# Patient Record
Sex: Female | Born: 1955
Health system: Southern US, Community
[De-identification: ages and names within clinical notes are randomized; demographics above are authoritative.]

## PROBLEM LIST (undated history)

## (undated) DIAGNOSIS — M797 Fibromyalgia: Secondary | ICD-10-CM

## (undated) DIAGNOSIS — M5030 Other cervical disc degeneration, unspecified cervical region: Secondary | ICD-10-CM

## (undated) HISTORY — PX: OTHER SURGICAL HISTORY: SHX169

## (undated) HISTORY — PX: TUBAL LIGATION: SHX77

## (undated) HISTORY — PX: TOTAL SHOULDER ARTHROPLASTY: SHX126

## (undated) HISTORY — DX: Other cervical disc degeneration, unspecified cervical region: M50.30

## (undated) HISTORY — PX: KNEE ARTHROPLASTY: SHX992

## (undated) HISTORY — DX: Fibromyalgia: M79.7

## (undated) HISTORY — PX: ABDOMINAL HYSTERECTOMY: SHX81

---

## 2002-09-17 ENCOUNTER — Ambulatory Visit (HOSPITAL_COMMUNITY): Admission: RE | Admit: 2002-09-17 | Discharge: 2002-09-17 | Payer: Self-pay | Admitting: General Surgery

## 2004-10-10 ENCOUNTER — Other Ambulatory Visit: Admission: RE | Admit: 2004-10-10 | Discharge: 2004-10-10 | Payer: Self-pay | Admitting: Obstetrics and Gynecology

## 2004-11-27 ENCOUNTER — Observation Stay (HOSPITAL_COMMUNITY): Admission: RE | Admit: 2004-11-27 | Discharge: 2004-11-28 | Payer: Self-pay | Admitting: Obstetrics and Gynecology

## 2004-11-27 ENCOUNTER — Encounter (INDEPENDENT_AMBULATORY_CARE_PROVIDER_SITE_OTHER): Payer: Self-pay | Admitting: *Deleted

## 2009-06-13 ENCOUNTER — Inpatient Hospital Stay (HOSPITAL_COMMUNITY): Admission: RE | Admit: 2009-06-13 | Discharge: 2009-06-15 | Payer: Self-pay | Admitting: Neurosurgery

## 2010-03-11 IMAGING — CR DG CERVICAL SPINE 2 OR 3 VIEWS
1 series · 1 of 1 positions shown · non-contrast
Comparison: None

CLINICAL DATA: C4-C6 fusion

CERVICAL SPINE - 2-3 VIEW

[view not recorded]
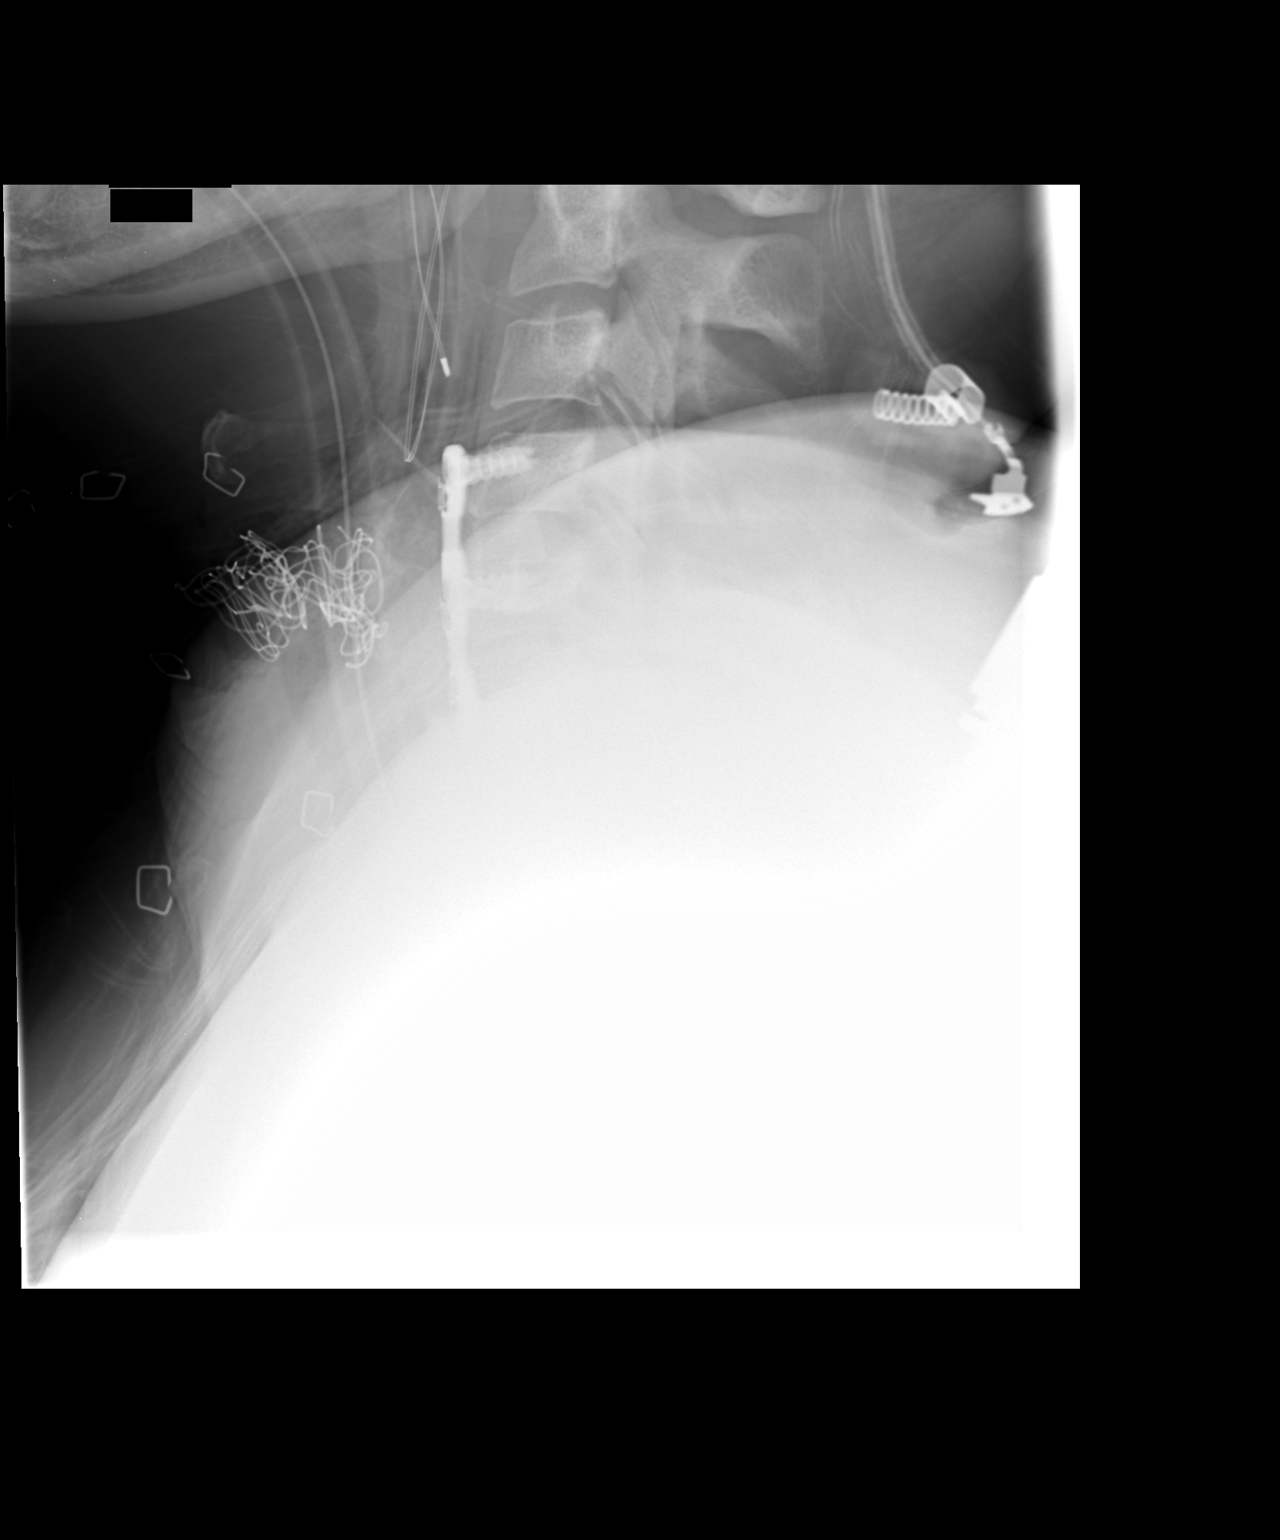

[1 of 1 positions shown; findings below may reference images not displayed]

FINDINGS: Initial film shows a needle marking the anterior disc
space of C4-5.  Second film shows anterior cervical discectomy and
fusion from C4-C6.  Interbody fusion material is in place.  There
is an anterior plate with screw fixation.  Sponges are in place
along operative approach.
IMPRESSION: ACDF C4-C7

## 2010-07-02 LAB — URINALYSIS, ROUTINE W REFLEX MICROSCOPIC
Glucose, UA: NEGATIVE mg/dL
Hgb urine dipstick: NEGATIVE
Protein, ur: NEGATIVE mg/dL
pH: 7.5 (ref 5.0–8.0)

## 2010-07-02 LAB — COMPREHENSIVE METABOLIC PANEL
Alkaline Phosphatase: 86 U/L (ref 39–117)
Creatinine, Ser: 0.77 mg/dL (ref 0.4–1.2)
GFR calc non Af Amer: >60 mL/min (ref >60)
Sodium: 139 mEq/L (ref 135–145)

## 2010-07-02 LAB — DIFFERENTIAL
Basophils Absolute: 0 10*3/uL (ref 0.0–0.1)
Eosinophils Absolute: 0 10*3/uL (ref 0.0–0.7)
Eosinophils Relative: 1 % (ref 0–5)
Lymphocytes Relative: 27 % (ref 12–46)
Lymphs Abs: 1.7 10*3/uL (ref 0.7–4.0)
Neutro Abs: 4.2 10*3/uL (ref 1.7–7.7)
Neutrophils Relative %: 66 % (ref 43–77)

## 2010-07-02 LAB — CBC
HCT: 36.2 % (ref 36.0–46.0)
MCHC: 33.7 g/dL (ref 30.0–36.0)
Platelets: 246 10*3/uL (ref 150–400)
RDW: 14.3 % (ref 11.5–15.5)

## 2010-07-02 LAB — PROTIME-INR
INR: 1.01 (ref 0.00–1.49)
Prothrombin Time: 13.2 seconds (ref 11.6–15.2)

## 2010-07-02 LAB — APTT: aPTT: 26 seconds (ref 24–37)

## 2010-07-02 LAB — SURGICAL PCR SCREEN: MRSA, PCR: NEGATIVE

## 2010-08-24 NOTE — Discharge Summary (Signed)
NAMEJAYDA, [name] NO.:  [id number]   MEDICAL RECORD NO.:  [id number]          PATIENT TYPE:  OBV   LOCATION:  9317                          [facility]ACILITY:  [facility]   PHYSICIAN:  Janine Limbo, M.D.DATE OF BIRTH:  Aug 24, 1955   DATE OF ADMISSION:  11/27/2004  DATE OF DISCHARGE:  11/28/2004                                 DISCHARGE SUMMARY   DISCHARGE DIAGNOSES:  1.  An 18-week size fibroid uterus.  2.  Anemia (hemoglobin 8.0).  3.  Dysmenorrhea.  4.  Menorrhagia.   PROCEDURES:  November 27, 2004, laparoscopy-assisted vaginal hysterectomy with  uterine morcellation.   HISTORY OF PRESENT ILLNESS:  [name] is a 55 year old female, para 3-0-2-  3, who presents with the above-mentioned diagnosis. She has not responded to  conservative measures. She presents for definitive therapy. Please see her  dictated history and physical exam for details.   ADMISSION PHYSICAL EXAM:  The patient weighs 218 pounds. Her pelvic exam is  significant for a 16-week size fibroid uterus.   HOSPITAL COURSE:  On the day of admission, the patient was taken to the  operating room where laparoscopy was performed. The patient was found to  have an 18-week size multifibroid uterus. A combination of the laparoscopic  approach plus vaginal approach was used to remove the uterus. Uterine  morcellation was required. The patient tolerated her procedure well. The  estimated blood loss for the procedure was 200 cc. Her postoperative  hemoglobin was 8.0 (preoperative hemoglobin 10.2). The patient quickly  tolerated a regular diet. The patient was not orthostatic. The patient felt  that she was ready to be discharged on postoperative day #1 and we agreed.  The patient was discharged to home in stable condition.   DISCHARGE MEDICATIONS:  1.  Vicodin 1-2 tablets every 4 hours as needed for pain.  2.  Ibuprofen 600 milligrams every 6 hours as needed for mild pain.  3.  Doxycycline 100 milligrams  twice each day for 7 days.  4.  Iron 325 milligrams 1 tablet twice each day for 6 weeks.   Final pathology report benign fibroids.      Janine Limbo, M.D.  Electronically Signed     AVS/MEDQ  D:  12/06/2004  T:  12/07/2004  Job:  [id number]

## 2010-08-24 NOTE — H&P (Signed)
[name], COMMISSO NO.:  [id number]   MEDICAL RECORD NO.:  [id number]          PATIENT TYPE:  AMB   LOCATION:  SDC                           [facility]   PHYSICIAN:  Janine Limbo, M.D.DATE OF BIRTH:  08/15/1955   DATE OF ADMISSION:  11/27/2004  DATE OF DISCHARGE:                                HISTORY & PHYSICAL   HISTORY OF PRESENT ILLNESS:  Ms. [name] is a 55 year old female, para 3-0-2-  3, who presents for a laparoscopy assisted vaginal hysterectomy.  She has  been followed at the [facility]  [facility].  The patient complains of heavy periods with  cramping.  She has tried hormonal therapy and did not tolerate the side  effects.  Nonsteroidal anti-inflammatory agents have not relieved her  discomfort.  She has had an endometrial biopsy that showed benign elements.  An ultrasound was performed that showed a 17.1-cm x 10.5-cm uterus with  multiple fibroids.  The ovaries appeared normal.  The patient's most recent  Pap smear was in July 2006, and it was within normal limits.  The patient  has had a prior tubal ligation.   OBSTETRICAL HISTORY:  1.  The patient has had three term vaginal deliveries.  2.  The patient has had two miscarriages in the first trimester.   DRUG ALLERGIES:  No known drug allergies.   PAST MEDICAL HISTORY:  1.  The patient has a history of hypercholesterolemia and she currently      takes Lipitor.  2.  She has had a tubal ligation as mentioned above.  3.  The patient has also had her wisdom teeth removed.  4.  She has had surgery on her knee.  5.  She has a history of anemia.   SOCIAL HISTORY:  The patient denies cigarette use, alcohol use, and  recreational drug use.   REVIEW OF SYSTEMS:  Please see history of present illness.   FAMILY HISTORY:  The patient has a family history of hypertension,  emphysema, anemia, and cancer.   PHYSICAL EXAMINATION:   VITAL SIGNS:  Weight is 218 pounds, height is 5 feet  7 inches.  HEENT:  Within normal limits.  CHEST:  Clear.  HEART:  Regular rate and rhythm.  BREASTS:  Without masses.  ABDOMEN:  Nontender.  There is a firm pelvic mass present.  EXTREMITIES:  Within normal limits.  NEUROLOGIC:  Grossly normal.  PELVIC:  External genitalia is normal.  The vagina is normal.  The cervix is  nontender and no lesions are appreciated.  The uterus is 16 weeks size and  irregular.  Adnexa:  No masses.  Rectovaginal exam confirms.   ASSESSMENT:  1.  A 16-week size fibroid uterus.  2.  Dysmenorrhea.  3.  Menorrhagia.   PLAN:  The patient will undergo a laparoscopy assisted vaginal hysterectomy.  She understands the indications for her surgical procedure and she accepts  the risk of, but not limited to, anesthetic complications, bleeding,  infections, and possible damage to the surrounding organs.      Janine Limbo, M.D.  Electronically Signed     AVS/MEDQ  D:  11/24/2004  T:  11/24/2004  Job:  [id number]   cc:   Rogue Jury  [address]  [place], [place] [address]

## 2010-08-24 NOTE — Op Note (Signed)
NAMECLEMMA, [name] NO.:  [id number]   MEDICAL RECORD NO.:  [id number]          PATIENT TYPE:  OBV   LOCATION:  9317                          [facility]   PHYSICIAN:  Janine Limbo, M.D.DATE OF BIRTH:  October 18, 1955   DATE OF PROCEDURE:  11/27/2004  DATE OF DISCHARGE:  11/28/2004                                 OPERATIVE REPORT   PREOPERATIVE DIAGNOSIS:  1.  16-week size fibroid uterus.  2.  Anemia.  3.  Dysmenorrhea.  4.  Menorrhagia.   POSTOPERATIVE DIAGNOSIS:  1.  18-week size fibroid uterus.  2.  Anemia.  3.  Dysmenorrhea.  4.  Menorrhagia.   PROCEDURE:  1.  Laparoscopy assisted vaginal hysterectomy.  2.  Uterine morcellation.   SURGEON:  Dr. Leonard Schwartz   FIRST ASSISTANT:  Osborn Coho, M.D.   ANESTHETIC:  Is general.   DISPOSITION:  [name] is a 55 year old female who presents with large  fibroids. She requests that we try to perform her procedure vaginally. She  understands the indications for the laparoscopy assisted vaginal  hysterectomy and she accepts the risk of, but not limited to, anesthetic  complications, bleeding, infection, and possible damage to surrounding  organs.   FINDINGS:  A 690 grams uterus was present. Multiple fibroids were noted. The  fallopian tubes and ovaries appeared normal.   PROCEDURE:  The patient was taken to the operating room where general  anesthetic was given. The patient's abdomen, perineum, and vagina were  prepped with multiple layers of Betadine. A Foley catheter was placed in the  bladder. Examination under anesthesia was performed. The Hulka tenaculum was  placed inside the uterus. The patient was then sterilely draped. The  subumbilical area was injected with half percent Marcaine with epinephrine.  Three suprapubic areas were injected with half percent Marcaine with  epinephrine. An incision was made in the subumbilical area and carried  sharply through the subcutaneous  tissue, fascia, and the anterior  peritoneum. The Hassan cannula was sutured into place. The pneumoperitoneum  was obtained. Three 5 mm trocars were placed in the lower abdomen under  direct visualization. The uterus was noted to be very large. We were however  able to isolate the left round ligament. The round ligament was then  cauterized and cut. We identified the left fallopian tube and the left  uretero-ovarian ligament. The structures were cauterized and cut. The  bladder flap was developed anteriorly through the laparoscope. The uterine  artery was skeletonized. Hemostasis was noted to be adequate. An identical  procedure was carried out on the opposite side. Again hemostasis was noted  to be adequate. The remainder of the bladder flap was developed for the  right side. At this point we felt we were ready to proceed with the vaginal  approach. The patient was placed in a more lithotomy position. A weighted  speculum was placed in the vagina. The cervix was injected with a diluted  solution of Pitressin and saline. A circumferential incision was made around  the cervix and the vaginal mucosa was advanced both anteriorly and  posteriorly. The posterior cul-de-sac was  sharply entered. Alternating from  right to left the uterosacral ligaments, paracervical tissues, parametrial  tissues, and uterine arteries were clamped, cut, sutured, and tied securely.  We then felt we needed to morcellate the uterus to remove it. The cervix was  transected. The center of the uterus was cored. Multiple fibroids were  removed. We then began to morcellate the posterior portion of the uterus.  Slowly we were able to decrease the volume of uterine tissue and fibroids.  We were finally able to invert the uterus through the posterior colpotomy.  The remainder of the parametrial tissues were clamped and cut and the  remainder of the uterus was removed. A small area was bleeding and was made  hemostatic using a  figure-of-eight suture. Hemostasis was then noted to be  adequate. The sutures attached to the uterosacral ligaments were brought out  through the vaginal angles and tied securely. A McCall culdoplasty suture  was placed in the posterior cul-de-sac incorporating the uterosacral  ligaments bilaterally. A final check was made for hemostasis and again  hemostasis was confirmed. The vaginal cuff was closed using figure-of-eight  sutures incorporating the anterior vaginal mucosa, the anterior peritoneum,  the posterior peritoneum, and the posterior vaginal mucosa. The McCall  culdoplasty suture was tied securely and the apex of the vagina was noted to  elevate into the mid pelvis. 0 Vicryl was suture material used throughout  the procedure. Sponge, needle, instrument counts were correct on two  occasions. Estimated blood loss was 200 mL. The patient tolerated the  procedure well. She was awakened from the anesthetic and taken to the  recovery room in stable condition. The patient was noted to drain clear  yellow urine at the end of our procedure.      Janine Limbo, M.D.  Electronically Signed     AVS/MEDQ  D:  12/06/2004  T:  12/06/2004  Job:  [id number]

## 2010-08-24 NOTE — H&P (Signed)
   [name], AUGHENBAUGH NO.:  [id number]   MEDICAL RECORD NO.:  [id number]                  PATIENT TYPE:   LOCATION:                                       FACILITY:   PHYSICIAN:  Dalia Heading, M.D.               DATE OF BIRTH:  April 09, 1955   DATE OF ADMISSION:  DATE OF DISCHARGE:                                HISTORY & PHYSICAL   CHIEF COMPLAINT:  Anemia.   HISTORY OF PRESENT ILLNESS:  The patient is a 55 year old black female who  was referred for a colonoscopy.  She was noted to be anemic on recent  physical examination.  She is being referred to rule out colon pathology.  She denies any abdominal complaints.  She denies any hemorrhoidal problems.   There is no family history of colon cancer.   PAST MEDICAL HISTORY:  Past medical history includes high cholesterol  levels.   PAST SURGICAL HISTORY:  Tubal ligation, orthopedic surgery.   CURRENT MEDICATIONS:  Lipitor and Vioxx.   ALLERGIES:  No known drug allergies.   REVIEW OF SYSTEMS:  The patient denies any epigastric pain or GERD.  She  denies any other cardiopulmonary difficulties or bleeding disorders.   PHYSICAL EXAMINATION:  GENERAL:  On physical examination, the patient is a  well-developed well-nourished black female in no acute distress.  VITAL SIGNS:  She is afebrile and vital signs are stable.  LUNGS:  Clear to auscultation with equal breath sounds bilaterally.  HEART:  Heart examination reveals a regular rate and rhythm without S3, S4,  or murmurs.  ABDOMEN:  The abdomen is benign.  RECTAL:  Examination was deferred to the procedure.   IMPRESSION:  Anemia of unknown etiology.    PLAN:  The patient is scheduled for colonoscopy on September 17, 2002.  The risks  and benefits of the procedure including bleeding and perforation were fully  explained to the patient who gave informed consent.                                               Dalia Heading, M.D.    MAJ/MEDQ   D:  09/02/2002  T:  09/02/2002  Job:  [id number]   cc:   Selinda Flavin  [address]  [place]  [place] [address]  Fax: [phone]

## 2013-09-14 ENCOUNTER — Other Ambulatory Visit: Payer: Self-pay | Admitting: Specialist

## 2013-10-06 ENCOUNTER — Other Ambulatory Visit: Payer: Self-pay | Admitting: Specialist

## 2016-06-20 ENCOUNTER — Telehealth (INDEPENDENT_AMBULATORY_CARE_PROVIDER_SITE_OTHER): Payer: Self-pay | Admitting: Specialist

## 2016-06-20 NOTE — Telephone Encounter (Signed)
CALLED PATIENT TO MOVE UP APPT DUE TO A CANCELATION ON DR.NITKA'S SCHEDULE. I CAN NOT HOLD THIS APPT FOR HER SO IF ITS GONE WHEN SHE CALLS BACK WE WILL HAVE TO WAIT FOR ANOTHER CANCELATION.

## 2016-06-26 ENCOUNTER — Ambulatory Visit (INDEPENDENT_AMBULATORY_CARE_PROVIDER_SITE_OTHER): Payer: Self-pay | Admitting: Specialist

## 2016-07-15 ENCOUNTER — Ambulatory Visit (INDEPENDENT_AMBULATORY_CARE_PROVIDER_SITE_OTHER): Payer: Medicare HMO

## 2016-07-15 ENCOUNTER — Encounter (INDEPENDENT_AMBULATORY_CARE_PROVIDER_SITE_OTHER): Payer: Self-pay | Admitting: Orthopedic Surgery

## 2016-07-15 ENCOUNTER — Ambulatory Visit (INDEPENDENT_AMBULATORY_CARE_PROVIDER_SITE_OTHER): Payer: Medicare HMO | Admitting: Orthopedic Surgery

## 2016-07-15 DIAGNOSIS — M25511 Pain in right shoulder: Secondary | ICD-10-CM

## 2016-07-16 DIAGNOSIS — M797 Fibromyalgia: Secondary | ICD-10-CM | POA: Insufficient documentation

## 2016-07-16 DIAGNOSIS — M19042 Primary osteoarthritis, left hand: Secondary | ICD-10-CM

## 2016-07-16 DIAGNOSIS — R5383 Other fatigue: Secondary | ICD-10-CM | POA: Insufficient documentation

## 2016-07-16 DIAGNOSIS — M7502 Adhesive capsulitis of left shoulder: Secondary | ICD-10-CM | POA: Insufficient documentation

## 2016-07-16 DIAGNOSIS — G2581 Restless legs syndrome: Secondary | ICD-10-CM | POA: Insufficient documentation

## 2016-07-16 DIAGNOSIS — M25511 Pain in right shoulder: Secondary | ICD-10-CM | POA: Diagnosis not present

## 2016-07-16 DIAGNOSIS — F5101 Primary insomnia: Secondary | ICD-10-CM | POA: Insufficient documentation

## 2016-07-16 DIAGNOSIS — M47816 Spondylosis without myelopathy or radiculopathy, lumbar region: Secondary | ICD-10-CM | POA: Insufficient documentation

## 2016-07-16 DIAGNOSIS — M170 Bilateral primary osteoarthritis of knee: Secondary | ICD-10-CM | POA: Insufficient documentation

## 2016-07-16 DIAGNOSIS — M19041 Primary osteoarthritis, right hand: Secondary | ICD-10-CM | POA: Insufficient documentation

## 2016-07-16 DIAGNOSIS — M47812 Spondylosis without myelopathy or radiculopathy, cervical region: Secondary | ICD-10-CM | POA: Insufficient documentation

## 2016-07-16 DIAGNOSIS — M546 Pain in thoracic spine: Secondary | ICD-10-CM | POA: Insufficient documentation

## 2016-07-16 MED ORDER — BUPIVACAINE HCL 0.5 % IJ SOLN
9.0000 mL | INTRAMUSCULAR | Status: AC | PRN
  Administered 2016-07-16: 9 mL via INTRA_ARTICULAR

## 2016-07-16 MED ORDER — LIDOCAINE HCL 1 % IJ SOLN
5.0000 mL | INTRAMUSCULAR | Status: AC | PRN
  Administered 2016-07-16: 5 mL

## 2016-07-16 MED ORDER — METHYLPREDNISOLONE ACETATE 40 MG/ML IJ SUSP
40.0000 mg | INTRAMUSCULAR | Status: AC | PRN
  Administered 2016-07-16: 40 mg via INTRA_ARTICULAR

## 2016-07-16 NOTE — Progress Notes (Signed)
Office Visit Note  Patient: [name]             Date of Birth: 10/01/1955           MRN: [id number]             PCP: Manon Hilding, MD Referring: No ref. provider found Visit Date: 07/26/2016 Occupation: '@GUAROCC' @    Subjective:  Generalized pain.   History of Present Illness: [name] is a 61 y.o. female  with history of fibromyalgia, disc disease and osteoarthritis. She states she continues to have generalized pain. She's been under a lot of stress lately which is causing flare of her symptoms. She describes pain in her neck and trapezius area. She also has some discomfort in the shoulder blade area, upper extremities lower extremities. She denies any joint swelling. She has some discomfort in her left knee joint. She rates the pain from fibromyalgia and scale of 0-10 about 7 and fatigue about 6. She continues to have discomfort in her bilateral shoulders. She had cortisone injection by Dr. Marlou Sa in her right shoulder recently.  Activities of Daily Living:  Patient reports morning stiffness for 30 minutes.   Patient Reports nocturnal pain.  Difficulty dressing/grooming: Denies Difficulty climbing stairs: Reports Difficulty getting out of chair: Reports Difficulty using hands for taps, buttons, cutlery, and/or writing: Reports   Review of Systems  Constitutional: Positive for fatigue. Negative for night sweats, weight gain, weight loss and weakness.  HENT: Negative for mouth sores, trouble swallowing, trouble swallowing, mouth dryness and nose dryness.   Eyes: Negative for pain, redness, visual disturbance and dryness.  Respiratory: Negative for cough, shortness of breath and difficulty breathing.   Cardiovascular: Negative for chest pain, palpitations, hypertension, irregular heartbeat and swelling in legs/feet.  Gastrointestinal: Negative for blood in stool, constipation and diarrhea.  Endocrine: Negative for increased urination.  Genitourinary: Negative  for vaginal dryness.  Musculoskeletal: Positive for arthralgias, joint pain, myalgias, morning stiffness and myalgias. Negative for joint swelling, muscle weakness and muscle tenderness.  Skin: Negative for color change, rash, hair loss, skin tightness, ulcers and sensitivity to sunlight.  Allergic/Immunologic: Negative for susceptible to infections.  Neurological: Negative for dizziness, memory loss and night sweats.  Hematological: Negative for swollen glands.  Psychiatric/Behavioral: Positive for sleep disturbance. Negative for depressed mood. The patient is nervous/anxious.     PMFS History:  Patient Active Problem List   Diagnosis Date Noted  . Dyslipidemia 07/22/2016  . Fibromyalgia 07/16/2016  . Other fatigue 07/16/2016  . Primary insomnia 07/16/2016  . DJD (degenerative joint disease), cervical 07/16/2016  . Spondylosis of lumbar region without myelopathy or radiculopathy 07/16/2016  . Primary osteoarthritis of both knees 07/16/2016  . Pain in thoracic spine 07/16/2016  . Primary osteoarthritis of both hands 07/16/2016  . Adhesive capsulitis of left shoulder 07/16/2016  . Restless leg syndrome 07/16/2016    Past Medical History:  Diagnosis Date  . DDD (degenerative disc disease), cervical   . Fibromyositis     History reviewed. No pertinent family history. Past Surgical History:  Procedure Laterality Date  . ABDOMINAL HYSTERECTOMY    . C-SPINE    . KNEE ARTHROPLASTY    . TOTAL SHOULDER ARTHROPLASTY    . TUBAL LIGATION     Social History   Social History Narrative  . No narrative on file     Objective: Vital Signs: BP 124/80 (BP Location: Left Arm, Patient Position: Sitting, Cuff Size: Normal)   Pulse 76   Resp  15   Ht 5' 7.5" (1.715 m)   Wt 223 lb (101.2 kg)   BMI 34.41 kg/m    Physical Exam  Constitutional: She is oriented to person, place, and time. She appears well-developed and well-nourished.  HENT:  Head: Normocephalic and atraumatic.  Eyes:  Conjunctivae and EOM are normal.  Neck: Normal range of motion.  Cardiovascular: Normal rate, regular rhythm, normal heart sounds and intact distal pulses.   Pulmonary/Chest: Effort normal and breath sounds normal.  Abdominal: Soft. Bowel sounds are normal.  Lymphadenopathy:    She has no cervical adenopathy.  Neurological: She is alert and oriented to person, place, and time.  Skin: Skin is warm and dry. Capillary refill takes less than 2 seconds.  Psychiatric: She has a normal mood and affect. Her behavior is normal.  Nursing note and vitals reviewed.    Musculoskeletal Exam: C-spine and thoracic lumbar spine she has limited range range of motion with discomfort. She has spasm in bilateral trapezius area. Shoulder joints although joints wrist joint MCPs PIPs DIPs with good range of motion. Hip joints knee joints ankles MTPs PIPs DIPs with good range of motion. With no synovitis. Fibromyalgia tender points were 16 out of 18 positive for generalized hyperalgesia.  CDAI Exam: No CDAI exam completed.    Investigation: Findings:  On April 05, 2010, comprehensive metabolic panel showed AST and ALT elevation of 51 and 52 respectively.  CK, TSH, and CBC were normal.  ACE level, ANA, rheumatoid factor, anti-CCP, and HLA-B27 were all negative.  Her sed rate was elevated at 44.      Imaging: Xr Shoulder Right  Result Date: 07/16/2016 Right shoulder AP outlet and axillary view reviewed.  Lung fields clear.  Mild acromioclavicular degenerative changes present.  No other soft tissue calcifications in the glenohumeral joint noted.  Joint is reduced.  Acromiohumeral distance is normal.   Speciality Comments: No specialty comments available.    Procedures:  Trigger Point Inj Date/Time: 07/26/2016 11:17 AM Performed by: Bo Merino Authorized by: Bo Merino   Consent Given by:  Patient Site marked: the procedure site was marked   Timeout: prior to procedure the correct  patient, procedure, and site was verified   Indications:  Muscle spasm and pain Total # of Trigger Points:  2 Location: neck   Needle Size:  27 G Approach:  Dorsal Medications #1:  20 mg triamcinolone acetonide 40 MG/ML; 1 mL lidocaine 1 % Patient tolerance:  Patient tolerated the procedure well with no immediate complications   Allergies: Patient has no known allergies.   Assessment / Plan:     Visit Diagnoses: Fibromyalgia: She is having a flare with increased generalized pain and positive tender points.  Other fatigue: Appears to be related to chronic insomnia.  Primary insomnia: Good sleep hygiene was discussed.  Neck pain she's been having a rough neck pain and discomfort in the trapezius area after informed consent was obtained per request bilateral trapezius area was injected with cortisone the  procedures described above.   DJD (degenerative joint disease), cervical - Status post discectomy  DDD (degenerative disc disease), thoracic  DDD lumbar : Chronic pain  Adhesive capsulitis of left shoulder - history of manipulation  Primary osteoarthritis of both hands: Some stiffness  Primary osteoarthritis of both knees: She is calm ongoing stiffness and pain. She had some intentional weight loss which she will continue to work on.  Restless leg syndrome  Dyslipidemia  History of depression : Patient has tried some medications  in the past and she would like to try [facility] again. File detailed discussion regarding indications side effects contraindications of Savella.   Orders: Orders Placed This Encounter  Procedures  . Hand/Upper Extremity Injection/Arthrocentesis  . Trigger Point Injection   No orders of the defined types were placed in this encounter.   Face-to-face time spent with patient was 30 minutes. 50% of time was spent in counseling and coordination of care.  Follow-Up Instructions: Return in about 6 months (around 01/25/2017) for FMS,DDD,OA.   Bo Merino, MD  Note - This record has been created using Editor, commissioning.  Chart creation errors have been sought, but may not always  have been located. Such creation errors do not reflect on  the standard of medical care.

## 2016-07-16 NOTE — Progress Notes (Signed)
Office Visit Note   Patient: [name]           Date of Birth: 06-04-1955           MRN: [id number] Visit Date: 07/15/2016 Requested by: No referring provider defined for this encounter. PCP: Estanislado Pandy, MD  Subjective: Chief Complaint  Patient presents with  . Right Shoulder - Pain  . Left Shoulder - Pain    HPI: [name] is a 61 year old female with bilateral shoulder pain right worse than left.  Pain is been going on the right shoulder for 2 months.  She is right-hand dominant.  She describes a decreased range of motion in the right shoulder.  It's hard for her to lay on the right-hand side.  She has a history of fibromyalgia.  She's had previous left shoulder manipulation.  She describes difficulty sleeping at night.  She also reports constant aching in that right shoulder.  She describes pain with extension.  She has history of left shoulder manipulation under anesthesia in 2012.  Symptoms have been going on in the right shoulder for a month.              ROS: All systems reviewed are negative as they relate to the chief complaint within the history of present illness.  Patient denies  fevers or chills.   Assessment & Plan: Visit Diagnoses:  1. Right shoulder pain, unspecified chronicity     Plan: Impression is early right frozen shoulder plan is injection today along with anti-inflammatory samples and an exercise sheet.  We'll see her back in 6 weeks for clinical recheck decision for or against another injection and/or formal physical therapy  Follow-Up Instructions: No Follow-up on file.   Orders:  Orders Placed This Encounter  Procedures  . XR Shoulder Right   No orders of the defined types were placed in this encounter.     Procedures: Large Joint Inj Date/Time: 07/16/2016 12:15 PM Performed by: Cammy Copa Authorized by: Cammy Copa   Consent Given by:  Patient Site marked: the procedure site was marked   Timeout: prior to  procedure the correct patient, procedure, and site was verified   Indications:  Pain and diagnostic evaluation Location:  Shoulder Site:  R glenohumeral Prep: patient was prepped and draped in usual sterile fashion   Needle Size:  18 G Needle Length:  1.5 inches Approach:  Posterior Ultrasound Guidance: No   Fluoroscopic Guidance: No   Arthrogram: No   Medications:  5 mL lidocaine 1 %; 9 mL bupivacaine 0.5 %; 40 mg methylPREDNISolone acetate 40 MG/ML Aspiration Attempted: No   Patient tolerance:  Patient tolerated the procedure well with no immediate complications     Clinical Data: No additional findings.  Objective: Vital Signs: There were no vitals taken for this visit.  Physical Exam:   Constitutional: Patient appears well-developed HEENT:  Head: Normocephalic Eyes:EOM are normal Neck: Normal range of motion Cardiovascular: Normal rate Pulmonary/chest: Effort normal Neurologic: Patient is alert Skin: Skin is warm Psychiatric: Patient has normal mood and affect    Ortho Exam: Orthopedic exam demonstrates good cervical spine range of motion intact motor sensory function in both hands.  Radial pulses intact bilaterally.  The patient does have restricted external rotation 15 abduction on the right compared to the left 40 compared to 60.  Isolated glenohumeral abduction and also is 85 on the right and 110 on the left.  Isolated glenohumeral forward flexion 140 on the right compared to  180 on the left.  Rotator cuff strength is intact on the right-hand side to isolated infraspinatus super space and subscap muscle testing.  No other masses lymph adenopathy or skin changes noted in the shoulder girdle region.  No distinct acromioclavicular joint tenderness to direct palpation  Specialty Comments:  No specialty comments available.  Imaging: Xr Shoulder Right  Result Date: 07/16/2016 Right shoulder AP outlet and axillary view reviewed.  Lung fields clear.  Mild  acromioclavicular degenerative changes present.  No other soft tissue calcifications in the glenohumeral joint noted.  Joint is reduced.  Acromiohumeral distance is normal.    PMFS History: There are no active problems to display for this patient.  No past medical history on file.  No family history on file.  No past surgical history on file. Social History   Occupational History  . Not on file.   Social History Main Topics  . Smoking status: Never Smoker  . Smokeless tobacco: Never Used  . Alcohol use Not on file  . Drug use: Unknown  . Sexual activity: Not on file

## 2016-07-17 ENCOUNTER — Ambulatory Visit: Payer: Self-pay | Admitting: Rheumatology

## 2016-07-22 DIAGNOSIS — E785 Hyperlipidemia, unspecified: Secondary | ICD-10-CM | POA: Insufficient documentation

## 2016-07-26 ENCOUNTER — Ambulatory Visit (INDEPENDENT_AMBULATORY_CARE_PROVIDER_SITE_OTHER): Payer: Medicare HMO | Admitting: Rheumatology

## 2016-07-26 ENCOUNTER — Encounter: Payer: Self-pay | Admitting: Rheumatology

## 2016-07-26 ENCOUNTER — Ambulatory Visit (INDEPENDENT_AMBULATORY_CARE_PROVIDER_SITE_OTHER): Payer: Self-pay | Admitting: Specialist

## 2016-07-26 VITALS — BP 124/80 | HR 76 | Resp 15 | Ht 67.5 in | Wt 223.0 lb

## 2016-07-26 DIAGNOSIS — M797 Fibromyalgia: Secondary | ICD-10-CM

## 2016-07-26 DIAGNOSIS — R5383 Other fatigue: Secondary | ICD-10-CM | POA: Diagnosis not present

## 2016-07-26 DIAGNOSIS — M47816 Spondylosis without myelopathy or radiculopathy, lumbar region: Secondary | ICD-10-CM

## 2016-07-26 DIAGNOSIS — M7502 Adhesive capsulitis of left shoulder: Secondary | ICD-10-CM | POA: Diagnosis not present

## 2016-07-26 DIAGNOSIS — M19041 Primary osteoarthritis, right hand: Secondary | ICD-10-CM | POA: Diagnosis not present

## 2016-07-26 DIAGNOSIS — M5134 Other intervertebral disc degeneration, thoracic region: Secondary | ICD-10-CM | POA: Diagnosis not present

## 2016-07-26 DIAGNOSIS — M542 Cervicalgia: Secondary | ICD-10-CM

## 2016-07-26 DIAGNOSIS — M5030 Other cervical disc degeneration, unspecified cervical region: Secondary | ICD-10-CM | POA: Diagnosis not present

## 2016-07-26 DIAGNOSIS — Z5181 Encounter for therapeutic drug level monitoring: Secondary | ICD-10-CM | POA: Diagnosis not present

## 2016-07-26 DIAGNOSIS — E785 Hyperlipidemia, unspecified: Secondary | ICD-10-CM

## 2016-07-26 DIAGNOSIS — F5101 Primary insomnia: Secondary | ICD-10-CM

## 2016-07-26 DIAGNOSIS — M170 Bilateral primary osteoarthritis of knee: Secondary | ICD-10-CM

## 2016-07-26 DIAGNOSIS — M47812 Spondylosis without myelopathy or radiculopathy, cervical region: Secondary | ICD-10-CM

## 2016-07-26 DIAGNOSIS — R69 Illness, unspecified: Secondary | ICD-10-CM | POA: Diagnosis not present

## 2016-07-26 DIAGNOSIS — G2581 Restless legs syndrome: Secondary | ICD-10-CM

## 2016-07-26 DIAGNOSIS — M19042 Primary osteoarthritis, left hand: Secondary | ICD-10-CM

## 2016-07-26 DIAGNOSIS — Z8659 Personal history of other mental and behavioral disorders: Secondary | ICD-10-CM

## 2016-07-26 LAB — COMPLETE METABOLIC PANEL WITH GFR
ALT: 13 U/L (ref 6–29)
AST: 18 U/L (ref 10–35)
Albumin: 4.1 g/dL (ref 3.6–5.1)
Alkaline Phosphatase: 68 U/L (ref 33–130)
BUN: 18 mg/dL (ref 7–25)
CALCIUM: 10 mg/dL (ref 8.6–10.4)
CHLORIDE: 104 mmol/L (ref 98–110)
CO2: 26 mmol/L (ref 20–31)
CREATININE: 0.93 mg/dL (ref 0.50–0.99)
GFR, EST AFRICAN AMERICAN: 77 mL/min (ref >=60)
GFR, EST NON AFRICAN AMERICAN: 67 mL/min (ref >=60)
Glucose, Bld: 98 mg/dL (ref 65–99)
Potassium: 4.3 mmol/L (ref 3.5–5.3)
Sodium: 138 mmol/L (ref 135–146)
Total Bilirubin: 0.6 mg/dL (ref 0.2–1.2)
Total Protein: 7 g/dL (ref 6.1–8.1)

## 2016-07-26 NOTE — Patient Instructions (Signed)
Milnacipran tablets What is this medicine? MILNACIPRAN (mil NA si pran) is used to treat the pain caused by fibromyalgia. This medicine may be used for other purposes; ask your health care provider or pharmacist if you have questions. COMMON BRAND NAME(S): Savella What should I tell my health care provider before I take this medicine? They need to know if you have any of these conditions: -bleeding disorders -glaucoma -heart disease -high blood pressure -if you often drink alcohol -irregular heart beat -kidney disease -liver disease -mania or bipolar disorder -prostate disease -seizures -suicidal thoughts, plans, or attempt; a previous suicide attempt by you or a family member -taken an MAOIs like Carbex, Eldepryl, Marplan, Nardil, and Parnate within 14 days -an unusual or allergic reaction to milnacipran, levomilnacipran, other medicines, foods, or preservatives -pregnant or trying to get pregnant -breast-feeding How should I use this medicine? Take this medicine by mouth with a glass of water. You can take it with or without food. If it upsets your stomach, take it with food. Follow the directions on the prescription label. Do not take your medicine more often than directed. Do not stop taking this medicine suddenly except upon the advice of your doctor. Stopping this medicine too quickly may cause serious side effects or your condition may worsen. A special MedGuide will be given to you by the pharmacist with each prescription and refill. Be sure to read this information carefully each time. Talk to your pediatrician regarding the use of this medicine in children. Special care may be needed. Overdosage: If you think you have taken too much of this medicine contact a poison control center or emergency room at once. NOTE: This medicine is only for you. Do not share this medicine with others. What if I miss a dose? If you miss a dose, skip that dose and take the next dose at your  regular time. Do not take double or extra doses. What may interact with this medicine? Do not take this medicine with any of the following medications: -desvenlafaxine -duloxetine -levomilnacipran -linezolid -MAOIs like Carbex, Eldepryl, Marplan, Nardil, and Parnate -methylene blue (injected into a vein) -venlafaxine This medicine may also interact with the following medications: -amphetamines -aspirin and aspirin-like medicines -clonidine -digoxin -epinephrine -furazolidone -lithium -medicines for blood pressure -medicines for migraine headache like almotriptan, eletriptan, frovatriptan, naratriptan, rizatriptan, sumatriptan, zolmitriptan -medicines that treat or prevent blood clots like warfarin, enoxaparin, and dalteparin -NSAIDS, medicines for pain and inflammation, like ibuprofen or naproxen -other medicines for depression, anxiety, or psychotic disturbances -procarbazine -rasagiline -St. John's wort, Hypericum perforatum -tramadol -tryptophan This list may not describe all possible interactions. Give your health care provider a list of all the medicines, herbs, non-prescription drugs, or dietary supplements you use. Also tell them if you smoke, drink alcohol, or use illegal drugs. Some items may interact with your medicine. What should I watch for while using this medicine? Tell your doctor if your symptoms do not get better or if they get worse. Visit your doctor or health care professional for regular checks on your progress. Patients and their families should watch out for new or worsening thoughts of suicide or depression. Also watch out for sudden changes in feelings such as feeling anxious, agitated, panicky, irritable, hostile, aggressive, impulsive, severely restless, overly excited and hyperactive, or not being able to sleep. If this happens, especially at the beginning of treatment or after a change in dose, call your health care professional. This medicine can cause  an increase in blood pressure. Check with   your doctor for instructions on monitoring your blood pressure while taking this medicine. You may get drowsy or dizzy. Do not drive, use machinery, or do anything that needs mental alertness until you know how this medicine affects you. Do not stand or sit up quickly, especially if you are an older patient. This reduces the risk of dizzy or fainting spells. Alcohol may interfere with the effect of this medicine. Avoid alcoholic drinks. Your mouth may get dry. Chewing sugarless gum, sucking hard candy and drinking plenty of water will help. Contact your doctor if the problem does not go away or is severe. What side effects may I notice from receiving this medicine? Side effects that you should report to your doctor or health care professional as soon as possible: -allergic reactions like skin rash, itching or hives, swelling of the face, lips, or tongue -anxious -breathing problems -confusion -changes in vision -chest pain -confusion -elevated mood, decreased need for sleep, racing thoughts, impulsive behavior -eye pain -fast, irregular heartbeat -feeling faint or lightheaded, falls -feeling agitated, angry, or irritable -hallucination, loss of contact with reality -high blood pressure -loss of balance or coordination -palpitations -redness, blistering, peeling or loosening of the skin, including inside the mouth -restlessness, pacing, inability to keep still -seizures -stiff muscles -suicidal thoughts or other mood changes -trouble passing urine or change in the amount of urine -trouble sleeping -unusual bleeding or bruising -unusually weak or tired -vomiting -yellowing of the eyes or skin Side effects that usually do not require medical attention (report to your doctor or health care professional if they continue or are bothersome): -change in sex drive or performance -change in appetite or weight -constipation -dizziness -dry  mouth -headache -increased sweating -nausea -tired This list may not describe all possible side effects. Call your doctor for medical advice about side effects. You may report side effects to FDA at 1-800-FDA-1088. Where should I keep my medicine? Keep out of the reach of children. Store at room temperature between 15 and 30 degrees C (59 and 86 degrees F). Throw away any unused medicine after the expiration date. NOTE: This sheet is a summary. It may not cover all possible information. If you have questions about this medicine, talk to your doctor, pharmacist, or health care provider.  2018 Elsevier/Gold Standard (2015-08-24 18:27:46)  

## 2016-07-26 NOTE — Progress Notes (Signed)
Pharmacy Note  Subjective:  Patient presents today to the [facility] to see Dr. Estanislado Pandy.  Patient seen by the pharmacist for counseling on milnacipran Ocie Cornfield).    Objective: Vitals:   07/26/16 1046  BP: 124/80  Pulse: 76  Resp: 15   CMP     Component Value Date/Time   NA 139 06/07/2009 1530   K 4.1 06/07/2009 1530   CL 106 06/07/2009 1530   CO2 26 06/07/2009 1530   GLUCOSE 96 06/07/2009 1530   BUN 20 06/07/2009 1530   CREATININE 0.77 06/07/2009 1530   CALCIUM 9.4 06/07/2009 1530   PROT 6.5 06/07/2009 1530   ALBUMIN 3.8 06/07/2009 1530   AST 33 06/07/2009 1530   ALT 35 06/07/2009 1530   ALKPHOS 86 06/07/2009 1530   BILITOT 0.6 06/07/2009 1530   GFRNONAA >60 06/07/2009 1530   GFRAA  06/07/2009 1530    >60        The eGFR has been calculated using the MDRD equation. This calculation has not been validated in all clinical situations. eGFR's persistently <60 mL/min signify possible Chronic Kidney Disease.   Updated CMP ordered today  Assessment/Plan: Patient was counseled on the purpose, proper use, and adverse effects of milnacipran.  Reviewed black box warning of increased risk of suicidal thoughts in young adults.  Patient has taken milnacipran in the past and denies any adverse effects with the medication except for mild nausea.  Advised patient that nausea may occur during therapy initiation but should hopefully improve over time.  Advised patient that administering with food may improve tolerability.  Patient voiced understanding.  Provided patient with educational materials on milnacipran and answered all questions.    Elisabeth Most, Pharm.D., BCPS Clinical Pharmacist Pager: [phone] Phone: [phone] 07/26/2016 11:42 AM

## 2016-07-29 ENCOUNTER — Telehealth: Payer: Self-pay | Admitting: Radiology

## 2016-07-29 MED ORDER — MILNACIPRAN HCL 12.5 MG PO TABS: 12.5000 mg | ORAL_TABLET | Freq: Every day | ORAL | 2 refills | Status: DC

## 2016-07-29 MED ORDER — LIDOCAINE HCL 1 % IJ SOLN
1.0000 mL | INTRAMUSCULAR | Status: AC | PRN
  Administered 2016-07-26: 1 mL

## 2016-07-29 MED ORDER — TRIAMCINOLONE ACETONIDE 40 MG/ML IJ SUSP
20.0000 mg | INTRAMUSCULAR | Status: AC | PRN
  Administered 2016-07-26: 20 mg via INTRAMUSCULAR

## 2016-07-29 NOTE — Telephone Encounter (Signed)
Okay to start Savella starter pack

## 2016-07-29 NOTE — Telephone Encounter (Signed)
She states she was to have Rx for savella sent in, it was not. Ok to send, what sig is needed?

## 2016-07-29 NOTE — Telephone Encounter (Signed)
I have called patient to advise labs are normal  

## 2016-07-29 NOTE — Telephone Encounter (Signed)
-----   Message from Pollyann Savoy, MD sent at 07/29/2016  9:17 AM EDT ----- CMP normal

## 2016-07-29 NOTE — Progress Notes (Signed)
CMP normal

## 2016-08-29 ENCOUNTER — Ambulatory Visit (INDEPENDENT_AMBULATORY_CARE_PROVIDER_SITE_OTHER): Payer: Medicare HMO | Admitting: Orthopedic Surgery

## 2017-05-09 DIAGNOSIS — Z6835 Body mass index (BMI) 35.0-35.9, adult: Secondary | ICD-10-CM | POA: Diagnosis not present

## 2017-05-09 DIAGNOSIS — J069 Acute upper respiratory infection, unspecified: Secondary | ICD-10-CM | POA: Diagnosis not present

## 2017-08-22 DIAGNOSIS — Z1231 Encounter for screening mammogram for malignant neoplasm of breast: Secondary | ICD-10-CM | POA: Diagnosis not present

## 2017-09-02 DIAGNOSIS — R69 Illness, unspecified: Secondary | ICD-10-CM | POA: Diagnosis not present

## 2017-09-22 DIAGNOSIS — S60211A Contusion of right wrist, initial encounter: Secondary | ICD-10-CM | POA: Diagnosis not present

## 2017-09-22 DIAGNOSIS — S0990XA Unspecified injury of head, initial encounter: Secondary | ICD-10-CM | POA: Diagnosis not present

## 2017-09-22 DIAGNOSIS — S8002XA Contusion of left knee, initial encounter: Secondary | ICD-10-CM | POA: Diagnosis not present

## 2017-09-22 DIAGNOSIS — M25562 Pain in left knee: Secondary | ICD-10-CM | POA: Diagnosis not present

## 2017-09-22 DIAGNOSIS — M25531 Pain in right wrist: Secondary | ICD-10-CM | POA: Diagnosis not present

## 2017-09-22 DIAGNOSIS — S0191XA Laceration without foreign body of unspecified part of head, initial encounter: Secondary | ICD-10-CM | POA: Diagnosis not present

## 2017-09-22 DIAGNOSIS — W0110XA Fall on same level from slipping, tripping and stumbling with subsequent striking against unspecified object, initial encounter: Secondary | ICD-10-CM | POA: Diagnosis not present

## 2017-09-22 DIAGNOSIS — T148XXA Other injury of unspecified body region, initial encounter: Secondary | ICD-10-CM | POA: Diagnosis not present

## 2017-09-22 DIAGNOSIS — S0181XA Laceration without foreign body of other part of head, initial encounter: Secondary | ICD-10-CM | POA: Diagnosis not present

## 2017-10-03 DIAGNOSIS — M4800 Spinal stenosis, site unspecified: Secondary | ICD-10-CM | POA: Diagnosis not present

## 2017-10-03 DIAGNOSIS — Z7722 Contact with and (suspected) exposure to environmental tobacco smoke (acute) (chronic): Secondary | ICD-10-CM | POA: Diagnosis not present

## 2017-10-03 DIAGNOSIS — K219 Gastro-esophageal reflux disease without esophagitis: Secondary | ICD-10-CM | POA: Diagnosis not present

## 2017-10-03 DIAGNOSIS — E559 Vitamin D deficiency, unspecified: Secondary | ICD-10-CM | POA: Diagnosis not present

## 2017-10-03 DIAGNOSIS — Z79891 Long term (current) use of opiate analgesic: Secondary | ICD-10-CM | POA: Diagnosis not present

## 2017-10-03 DIAGNOSIS — G8929 Other chronic pain: Secondary | ICD-10-CM | POA: Diagnosis not present

## 2017-10-03 DIAGNOSIS — Z791 Long term (current) use of non-steroidal anti-inflammatories (NSAID): Secondary | ICD-10-CM | POA: Diagnosis not present

## 2017-10-03 DIAGNOSIS — E669 Obesity, unspecified: Secondary | ICD-10-CM | POA: Diagnosis not present

## 2017-10-03 DIAGNOSIS — R69 Illness, unspecified: Secondary | ICD-10-CM | POA: Diagnosis not present

## 2017-10-03 DIAGNOSIS — M797 Fibromyalgia: Secondary | ICD-10-CM | POA: Diagnosis not present

## 2017-11-20 DIAGNOSIS — R69 Illness, unspecified: Secondary | ICD-10-CM | POA: Diagnosis not present

## 2017-12-26 DIAGNOSIS — H524 Presbyopia: Secondary | ICD-10-CM | POA: Diagnosis not present

## 2018-02-17 DIAGNOSIS — K219 Gastro-esophageal reflux disease without esophagitis: Secondary | ICD-10-CM | POA: Diagnosis not present

## 2018-04-06 DIAGNOSIS — R69 Illness, unspecified: Secondary | ICD-10-CM | POA: Diagnosis not present

## 2018-04-13 DIAGNOSIS — Z1211 Encounter for screening for malignant neoplasm of colon: Secondary | ICD-10-CM | POA: Diagnosis not present

## 2018-04-28 NOTE — Progress Notes (Signed)
Office Visit Note  Patient: [name]             Date of Birth: 12/21/1955           MRN: [id number]             PCP: Estanislado PandySasser, Paul W, MD Referring: Estanislado PandySasser, Paul W, MD Visit Date: 05/01/2018 Occupation: @GUAROCC @  Subjective:  No chief complaint on file.   History of Present Illness: [name] is a 63 y.o. female with history of fibromyalgia osteoarthritis and degenerative disc disease.  She states she fell after Christmas on her left knee has been painful since then.  Although she has had pain in bilateral knee joints for a long time.  She also has been experiencing a lot of pain in the trapezius area.  She has soreness in her arm muscles.  She states she continues to have some cervical thoracic and lumbar pain due to underlying disc disease.  She was recently seen by the neurosurgeon.  Activities of Daily Living:  Patient reports morning stiffness for several hours.   Patient Reports nocturnal pain.  Difficulty dressing/grooming: Reports Difficulty climbing stairs: Reports Difficulty getting out of chair: Reports Difficulty using hands for taps, buttons, cutlery, and/or writing: Denies  Review of Systems  Constitutional: Positive for fatigue.  HENT: Negative for mouth sores and mouth dryness.   Eyes: Negative for pain, itching and dryness.  Respiratory: Negative for shortness of breath, wheezing and difficulty breathing.   Cardiovascular: Negative for chest pain, palpitations and irregular heartbeat.  Gastrointestinal: Negative for abdominal pain, constipation and diarrhea.  Endocrine: Negative for increased urination.  Genitourinary: Negative for painful urination, nocturia and pelvic pain.  Musculoskeletal: Positive for arthralgias, joint pain, joint swelling, muscle weakness, morning stiffness and muscle tenderness.  Skin: Negative for rash and redness.  Allergic/Immunologic: Negative for susceptible to infections.  Neurological: Negative for dizziness,  light-headedness, headaches, memory loss and weakness.  Hematological: Negative for bruising/bleeding tendency.  Psychiatric/Behavioral: Negative for confusion. The patient is not nervous/anxious.     PMFS History:  Patient Active Problem List   Diagnosis Date Noted  . Dyslipidemia 07/22/2016  . Fibromyalgia 07/16/2016  . Other fatigue 07/16/2016  . Primary insomnia 07/16/2016  . DJD (degenerative joint disease), cervical 07/16/2016  . Spondylosis of lumbar region without myelopathy or radiculopathy 07/16/2016  . Primary osteoarthritis of both knees 07/16/2016  . Pain in thoracic spine 07/16/2016  . Primary osteoarthritis of both hands 07/16/2016  . Adhesive capsulitis of left shoulder 07/16/2016  . Restless leg syndrome 07/16/2016    Past Medical History:  Diagnosis Date  . DDD (degenerative disc disease), cervical   . Fibromyositis     History reviewed. No pertinent family history. Past Surgical History:  Procedure Laterality Date  . ABDOMINAL HYSTERECTOMY    . C-SPINE    . KNEE ARTHROPLASTY    . TOTAL SHOULDER ARTHROPLASTY    . TUBAL LIGATION     Social History   Social History Narrative  . Not on file    There is no immunization history on file for this patient.   Objective: Vital Signs: BP 119/77 (BP Location: Right Arm, Patient Position: Sitting, Cuff Size: Normal)   Pulse 71   Resp 14   Ht 5\' 7"  (1.702 m)   Wt 237 lb 12.8 oz (107.9 kg)   BMI 37.24 kg/m    Physical Exam Vitals signs and nursing note reviewed.  Constitutional:      Appearance: She is well-developed.  HENT:     Head: Normocephalic and atraumatic.  Eyes:     Conjunctiva/sclera: Conjunctivae normal.  Neck:     Musculoskeletal: Normal range of motion.  Cardiovascular:     Rate and Rhythm: Normal rate and regular rhythm.     Heart sounds: Normal heart sounds.  Pulmonary:     Effort: Pulmonary effort is normal.     Breath sounds: Normal breath sounds.  Abdominal:     General: Bowel  sounds are normal.     Palpations: Abdomen is soft.  Lymphadenopathy:     Cervical: No cervical adenopathy.  Skin:    General: Skin is warm and dry.     Capillary Refill: Capillary refill takes less than 2 seconds.  Neurological:     Mental Status: She is alert and oriented to person, place, and time.  Psychiatric:        Behavior: Behavior normal.      Musculoskeletal Exam: C-spine limited range of motion.  Thoracic and lumbar spine painful range of motion.  She had painful range of motion of bilateral shoulders.  Elbow joints wrist joints MCPs PIPs DIPs with good range of motion with no synovitis.  She is crepitus and discomfort range of motion of bilateral knee joints.  No synovitis was noted.  CDAI Exam: CDAI Score: Not documented Patient Global Assessment: Not documented; Provider Global Assessment: Not documented Swollen: Not documented; Tender: Not documented Joint Exam   Not documented   There is currently no information documented on the homunculus. Go to the Rheumatology activity and complete the homunculus joint exam.  Investigation: No additional findings.  Imaging: Xr Knee 3 View Left  Result Date: 05/01/2018 Moderate to severe medial compartment narrowing.  No chondrocalcinosis was noted.  Mild patellofemoral narrowing was noted. Impression: These findings are consistent with moderate to severe osteoarthritis and  mild chondromalacia patella.  Xr Knee 3 View Right  Result Date: 05/01/2018 Severe medial compartment narrowing with medial osteophytes were noted.  Moderate patellofemoral narrowing was noted.  No chondrocalcinosis was noted. Impression: These findings are consistent with severe osteoarthritis and moderate chondromalacia patella.   Recent Labs: Lab Results  Component Value Date   WBC 6.3 06/07/2009   HGB 12.2 06/07/2009   PLT 246 06/07/2009   NA 138 07/26/2016   K 4.3 07/26/2016   CL 104 07/26/2016   CO2 26 07/26/2016   GLUCOSE 98 07/26/2016     BUN 18 07/26/2016   CREATININE 0.93 07/26/2016   BILITOT 0.6 07/26/2016   ALKPHOS 68 07/26/2016   AST 18 07/26/2016   ALT 13 07/26/2016   PROT 7.0 07/26/2016   ALBUMIN 4.1 07/26/2016   CALCIUM 10.0 07/26/2016   GFRAA 77 07/26/2016    Speciality Comments: No specialty comments available.  Procedures:  Trigger Point Inj Date/Time: 05/01/2018 12:18 PM Performed by: Pollyann Savoy, MD Authorized by: Pollyann Savoy, MD   Consent Given by:  Patient Site marked: the procedure site was marked   Timeout: prior to procedure the correct patient, procedure, and site was verified   Indications:  Muscle spasm and pain Total # of Trigger Points:  2 Location: neck   Needle Size:  27 G Approach:  Dorsal Medications #1:  0.5 mL lidocaine 1 %; 10 mg triamcinolone acetonide 40 MG/ML Medications #2:  0.5 mL lidocaine 1 %; 10 mg triamcinolone acetonide 40 MG/ML Patient tolerance:  Patient tolerated the procedure well with no immediate complications   Allergies: Patient has no known allergies.   Assessment /  Plan:     Visit Diagnoses: Fibromyalgia-she continues to have some generalized pain discomfort and positive tender points.  Primary insomnia-good sleep hygiene was discussed.  Other fatigue-secondary to insomnia and fibromyalgia.  Primary osteoarthritis of both hands-joint protection muscle strengthening was discussed.  Chronic pain of both knees -she has been experiencing increased pain and discomfort in her knee joints.  Left knee joint has been hurting more since her last fall.  The x-rays today showed moderate osteoarthritis in her left knee and severe osteoarthritis in her right knee and bilateral moderate patellofemoral narrowing.  Plan: XR KNEE 3 VIEW LEFT, XR KNEE 3 VIEW RIGHT.  X-ray findings were discussed with patient.  She has done Visco supplement injections in the past and had good results.  She wants us to apply for that again.  Weight loss diet and exercise was  discussed at length.  Primary osteoarthritis of both knees  Adhesive capsulitis of left shoulder - history of manipulation.  She has been having discomfort in her bilateral shoulders.  Have given her a handout on shoulder joint exercises.  DDD (degenerative disc disease), cervical - Status post discectomy.  She has been having bilateral trapezius spasm and discomfort.  Per her request bilateral trapezius area was prepped in sterile fashion injected with cortisone as described above.  DDD (degenerative disc disease), thoracic-chronic pain.  DDD lumbar -chronic pain.  Dyslipidemia  History of depression  Restless leg syndrome   Orders: Orders Placed This Encounter  Procedures  . Trigger Point Inj  . XR KNEE 3 VIEW LEFT  . XR KNEE 3 VIEW RIGHT   No orders of the defined types were placed in this encounter.   Face-to-face time spent with patient was 30 minutes. Greater than 50% of time was spent in counseling and coordination of care.  Follow-Up Instructions: Return in about 6 months (around 10/30/2018) for FMS, OA, DDD.   Pollyann SavoyShaili Ivonne Freeburg, MD  Note - This record has been created using Animal nutritionistDragon software.  Chart creation errors have been sought, but may not always  have been located. Such creation errors do not reflect on  the standard of medical care.

## 2018-05-01 ENCOUNTER — Ambulatory Visit (INDEPENDENT_AMBULATORY_CARE_PROVIDER_SITE_OTHER): Payer: Self-pay

## 2018-05-01 ENCOUNTER — Ambulatory Visit (INDEPENDENT_AMBULATORY_CARE_PROVIDER_SITE_OTHER): Payer: Managed Care, Other (non HMO)

## 2018-05-01 ENCOUNTER — Telehealth: Payer: Self-pay

## 2018-05-01 ENCOUNTER — Encounter: Payer: Self-pay | Admitting: Rheumatology

## 2018-05-01 ENCOUNTER — Ambulatory Visit: Payer: Medicare HMO | Admitting: Rheumatology

## 2018-05-01 VITALS — BP 119/77 | HR 71 | Resp 14 | Ht 67.0 in | Wt 237.8 lb

## 2018-05-01 DIAGNOSIS — G2581 Restless legs syndrome: Secondary | ICD-10-CM

## 2018-05-01 DIAGNOSIS — G8929 Other chronic pain: Secondary | ICD-10-CM

## 2018-05-01 DIAGNOSIS — M25561 Pain in right knee: Secondary | ICD-10-CM

## 2018-05-01 DIAGNOSIS — M25562 Pain in left knee: Secondary | ICD-10-CM

## 2018-05-01 DIAGNOSIS — M19041 Primary osteoarthritis, right hand: Secondary | ICD-10-CM | POA: Diagnosis not present

## 2018-05-01 DIAGNOSIS — M5134 Other intervertebral disc degeneration, thoracic region: Secondary | ICD-10-CM

## 2018-05-01 DIAGNOSIS — M47816 Spondylosis without myelopathy or radiculopathy, lumbar region: Secondary | ICD-10-CM

## 2018-05-01 DIAGNOSIS — M542 Cervicalgia: Secondary | ICD-10-CM | POA: Diagnosis not present

## 2018-05-01 DIAGNOSIS — R5383 Other fatigue: Secondary | ICD-10-CM

## 2018-05-01 DIAGNOSIS — Z8659 Personal history of other mental and behavioral disorders: Secondary | ICD-10-CM

## 2018-05-01 DIAGNOSIS — M170 Bilateral primary osteoarthritis of knee: Secondary | ICD-10-CM

## 2018-05-01 DIAGNOSIS — M797 Fibromyalgia: Secondary | ICD-10-CM

## 2018-05-01 DIAGNOSIS — F5101 Primary insomnia: Secondary | ICD-10-CM | POA: Diagnosis not present

## 2018-05-01 DIAGNOSIS — M5030 Other cervical disc degeneration, unspecified cervical region: Secondary | ICD-10-CM | POA: Diagnosis not present

## 2018-05-01 DIAGNOSIS — E785 Hyperlipidemia, unspecified: Secondary | ICD-10-CM

## 2018-05-01 DIAGNOSIS — M7502 Adhesive capsulitis of left shoulder: Secondary | ICD-10-CM | POA: Diagnosis not present

## 2018-05-01 DIAGNOSIS — R69 Illness, unspecified: Secondary | ICD-10-CM | POA: Diagnosis not present

## 2018-05-01 DIAGNOSIS — M19042 Primary osteoarthritis, left hand: Secondary | ICD-10-CM

## 2018-05-01 MED ORDER — LIDOCAINE HCL 1 % IJ SOLN
0.5000 mL | INTRAMUSCULAR | Status: AC | PRN
  Administered 2018-05-01: .5 mL

## 2018-05-01 MED ORDER — TRIAMCINOLONE ACETONIDE 40 MG/ML IJ SUSP
10.0000 mg | INTRAMUSCULAR | Status: AC | PRN
  Administered 2018-05-01: 10 mg via INTRAMUSCULAR

## 2018-05-01 NOTE — Patient Instructions (Signed)
Knee Exercises              Ask your health care provider which exercises are safe for you. Do exercises exactly as told by your health care provider and adjust them as directed. It is normal to feel mild stretching, pulling, tightness, or discomfort as you do these exercises, but you should stop right away if you feel sudden pain or your pain gets worse.Do not begin these exercises until told by your health care provider. STRETCHING AND RANGE OF MOTION EXERCISES These exercises warm up your muscles and joints and improve the movement and flexibility of your knee. These exercises also help to relieve pain, numbness, and tingling. Exercise A: Knee Extension, Prone 1. Lie on your abdomen on a bed. 2. Place your left / right knee just beyond the edge of the surface so your knee is not on the bed. You can put a towel under your left / right thigh just above your knee for comfort. 3. Relax your leg muscles and allow gravity to straighten your knee. You should feel a stretch behind your left / right knee. 4. Hold this position for __________ seconds. 5. Scoot up so your knee is supported between repetitions. Repeat __________ times. Complete this stretch __________ times a day. Exercise B: Knee Flexion, Active 1. Lie on your back with both knees straight. If this causes back discomfort, bend your left / right knee so your foot is flat on the floor. 2. Slowly slide your left / right heel back toward your buttocks until you feel a gentle stretch in the front of your knee or thigh. 3. Hold this position for __________ seconds. 4. Slowly slide your left / right heel back to the starting position. Repeat __________ times. Complete this exercise __________ times a day. Exercise C: Quadriceps, Prone 1. Lie on your abdomen on a firm surface, such as a bed or padded floor. 2. Bend your left / right knee and hold your ankle. If you cannot reach your ankle or pant leg, loop a belt around your foot and  grab the belt instead. 3. Gently pull your heel toward your buttocks. Your knee should not slide out to the side. You should feel a stretch in the front of your thigh and knee. 4. Hold this position for __________ seconds. Repeat __________ times. Complete this stretch __________ times a day. Exercise D: Hamstring, Supine 1. Lie on your back. 2. Loop a belt or towel over the ball of your left / right foot. The ball of your foot is on the walking surface, right under your toes. 3. Straighten your left / right knee and slowly pull on the belt to raise your leg until you feel a gentle stretch behind your knee. ? Do not let your left / right knee bend while you do this. ? Keep your other leg flat on the floor. 4. Hold this position for __________ seconds. Repeat __________ times. Complete this stretch __________ times a day. STRENGTHENING EXERCISES These exercises build strength and endurance in your knee. Endurance is the ability to use your muscles for a long time, even after they get tired. Exercise E: Quadriceps, Isometric 1. Lie on your back with your left / right leg extended and your other knee bent. Put a rolled towel or small pillow under your knee if told by your health care provider. 2. Slowly tense the muscles in the front of your left / right thigh. You should see your kneecap slide up toward your   hip or see increased dimpling just above the knee. This motion will push the back of the knee toward the floor. 3. For __________ seconds, keep the muscle as tight as you can without increasing your pain. 4. Relax the muscles slowly and completely. Repeat __________ times. Complete this exercise __________ times a day. Exercise F: Straight Leg Raises - Quadriceps 1. Lie on your back with your left / right leg extended and your other knee bent. 2. Tense the muscles in the front of your left / right thigh. You should see your kneecap slide up or see increased dimpling just above the knee. Your  thigh may even shake a bit. 3. Keep these muscles tight as you raise your leg 4-6 inches (10-15 cm) off the floor. Do not let your knee bend. 4. Hold this position for __________ seconds. 5. Keep these muscles tense as you lower your leg. 6. Relax your muscles slowly and completely after each repetition. Repeat __________ times. Complete this exercise __________ times a day. Exercise G: Hamstring, Isometric 1. Lie on your back on a firm surface. 2. Bend your left / right knee approximately __________ degrees. 3. Dig your left / right heel into the surface as if you are trying to pull it toward your buttocks. Tighten the muscles in the back of your thighs to dig as hard as you can without increasing any pain. 4. Hold this position for __________ seconds. 5. Release the tension gradually and allow your muscles to relax completely for __________ seconds after each repetition. Repeat __________ times. Complete this exercise __________ times a day. Exercise H: Hamstring Curls If told by your health care provider, do this exercise while wearing ankle weights. Begin with __________ weights. Then increase the weight by 1 lb (0.5 kg) increments. Do not wear ankle weights that are more than __________. 1. Lie on your abdomen with your legs straight. 2. Bend your left / right knee as far as you can without feeling pain. Keep your hips flat against the floor. 3. Hold this position for __________ seconds. 4. Slowly lower your leg to the starting position. Repeat __________ times. Complete this exercise __________ times a day. Exercise I: Squats (Quadriceps) 1. Stand in front of a table, with your feet and knees pointing straight ahead. You may rest your hands on the table for balance but not for support. 2. Slowly bend your knees and lower your hips like you are going to sit in a chair. ? Keep your weight over your heels, not over your toes. ? Keep your lower legs upright so they are parallel with the  table legs. ? Do not let your hips go lower than your knees. ? Do not bend lower than told by your health care provider. ? If your knee pain increases, do not bend as low. 3. Hold the squat position for __________ seconds. 4. Slowly push with your legs to return to standing. Do not use your hands to pull yourself to standing. Repeat __________ times. Complete this exercise __________ times a day. Exercise J: Wall Slides (Quadriceps) 1. Lean your back against a smooth wall or door while you walk your feet out 18-24 inches (46-61 cm) from it. 2. Place your feet hip-width apart. 3. Slowly slide down the wall or door until your knees bend __________ degrees. Keep your knees over your heels, not over your toes. Keep your knees in line with your hips. 4. Hold for __________ seconds. Repeat __________ times. Complete this exercise __________ times a   day. Exercise K: Straight Leg Raises - Hip Abductors 1. Lie on your side with your left / right leg in the top position. Lie so your head, shoulder, knee, and hip line up. You may bend your bottom knee to help you keep your balance. 2. Roll your hips slightly forward so your hips are stacked directly over each other and your left / right knee is facing forward. 3. Leading with your heel, lift your top leg 4-6 inches (10-15 cm). You should feel the muscles in your outer hip lifting. ? Do not let your foot drift forward. ? Do not let your knee roll toward the ceiling. 4. Hold this position for __________ seconds. 5. Slowly return your leg to the starting position. 6. Let your muscles relax completely after each repetition. Repeat __________ times. Complete this exercise __________ times a day. Exercise L: Straight Leg Raises - Hip Extensors 1. Lie on your abdomen on a firm surface. You can put a pillow under your hips if that is more comfortable. 2. Tense the muscles in your buttocks and lift your left / right leg about 4-6 inches (10-15 cm). Keep your  knee straight as you lift your leg. 3. Hold this position for __________ seconds. 4. Slowly lower your leg to the starting position. 5. Let your leg relax completely after each repetition. Repeat __________ times. Complete this exercise __________ times a day. This information is not intended to replace advice given to you by your health care provider. Make sure you discuss any questions you have with your health care provider. Document Released: 02/06/2005 Document Revised: 12/18/2015 Document Reviewed: 01/29/2015 [employer] Interactive Patient Education  2019 [employer] Shoulder Exercises Ask your health care provider which exercises are safe for you. Do exercises exactly as told by your health care provider and adjust them as directed. It is normal to feel mild stretching, pulling, tightness, or discomfort as you do these exercises, but you should stop right away if you feel sudden pain or your pain gets worse.Do not begin these exercises until told by your health care provider. Range of Motion Exercises        These exercises warm up your muscles and joints and improve the movement and flexibility of your shoulder. These exercises also help to relieve pain, numbness, and tingling. These exercises involve stretching your injured shoulder directly. Exercise A: Pendulum 1. Stand near a wall or a surface that you can hold onto for balance. 2. Bend at the waist and let your left / right arm hang straight down. Use your other arm to support you. Keep your back straight and do not lock your knees. 3. Relax your left / right arm and shoulder muscles, and move your hips and your trunk so your left / right arm swings freely. Your arm should swing because of the motion of your body, not because you are using your arm or shoulder muscles. 4. Keep moving your body so your arm swings in the following directions, as told by your health care provider: ? Side to side. ? Forward and backward. ? In  clockwise and counterclockwise circles. 5. Continue each motion for __________ seconds, or for as long as told by your health care provider. 6. Slowly return to the starting position. Repeat __________ times. Complete this exercise __________ times a day. Exercise B:Flexion, Standing 1. Stand and hold a broomstick, a cane, or a similar object. Place your hands a little more than shoulder-width apart on the object. Your left / right   hand should be palm-up, and your other hand should be palm-down. 2. Keep your elbow straight and keep your shoulder muscles relaxed. Push the stick down with your healthy arm to raise your left / right arm in front of your body, and then over your head until you feel a stretch in your shoulder. ? Avoid shrugging your shoulder while you raise your arm. Keep your shoulder blade tucked down toward the middle of your back. 3. Hold for __________ seconds. 4. Slowly return to the starting position. Repeat __________ times. Complete this exercise __________ times a day. Exercise C: Abduction, Standing 1. Stand and hold a broomstick, a cane, or a similar object. Place your hands a little more than shoulder-width apart on the object. Your left / right hand should be palm-up, and your other hand should be palm-down. 2. While keeping your elbow straight and your shoulder muscles relaxed, push the stick across your body toward your left / right side. Raise your left / right arm to the side of your body and then over your head until you feel a stretch in your shoulder. ? Do not raise your arm above shoulder height, unless your health care provider tells you to do that. ? Avoid shrugging your shoulder while you raise your arm. Keep your shoulder blade tucked down toward the middle of your back. 3. Hold for __________ seconds. 4. Slowly return to the starting position. Repeat __________ times. Complete this exercise __________ times a day. Exercise D:Internal Rotation 1. Place your  left / right hand behind your back, palm-up. 2. Use your other hand to dangle an exercise band, a towel, or a similar object over your shoulder. Grasp the band with your left / right hand so you are holding onto both ends. 3. Gently pull up on the band until you feel a stretch in the front of your left / right shoulder. ? Avoid shrugging your shoulder while you raise your arm. Keep your shoulder blade tucked down toward the middle of your back. 4. Hold for __________ seconds. 5. Release the stretch by letting go of the band and lowering your hands. Repeat __________ times. Complete this exercise __________ times a day. Stretching Exercises  These exercises warm up your muscles and joints and improve the movement and flexibility of your shoulder. These exercises also help to relieve pain, numbness, and tingling. These exercises are done using your healthy shoulder to help stretch the muscles of your injured shoulder. Exercise E: Corner Stretch (External Rotation and Abduction) 1. Stand in a doorway with one of your feet slightly in front of the other. This is called a staggered stance. If you cannot reach your forearms to the door frame, stand facing a corner of a room. 2. Choose one of the following positions as told by your health care provider: ? Place your hands and forearms on the door frame above your head. ? Place your hands and forearms on the door frame at the height of your head. ? Place your hands on the door frame at the height of your elbows. 3. Slowly move your weight onto your front foot until you feel a stretch across your chest and in the front of your shoulders. Keep your head and chest upright and keep your abdominal muscles tight. 4. Hold for __________ seconds. 5. To release the stretch, shift your weight to your back foot. Repeat __________ times. Complete this stretch __________ times a day. Exercise F:Extension, Standing 1. Stand and hold a broomstick, a cane,   or a  similar object behind your back. ? Your hands should be a little wider than shoulder-width apart. ? Your palms should face away from your back. 2. Keeping your elbows straight and keeping your shoulder muscles relaxed, move the stick away from your body until you feel a stretch in your shoulder. ? Avoid shrugging your shoulders while you move the stick. Keep your shoulder blade tucked down toward the middle of your back. 3. Hold for __________ seconds. 4. Slowly return to the starting position. Repeat __________ times. Complete this exercise __________ times a day. Strengthening Exercises           These exercises build strength and endurance in your shoulder. Endurance is the ability to use your muscles for a long time, even after they get tired. Exercise G:External Rotation 1. Sit in a stable chair without armrests. 2. Secure an exercise band at elbow height on your left / right side. 3. Place a soft object, such as a folded towel or a small pillow, between your left / right upper arm and your body to move your elbow a few inches away (about 10 cm) from your side. 4. Hold the end of the band so it is tight and there is no slack. 5. Keeping your elbow pressed against the soft object, move your left / right forearm out, away from your abdomen. Keep your body steady so only your forearm moves. 6. Hold for __________ seconds. 7. Slowly return to the starting position. Repeat __________ times. Complete this exercise __________ times a day. Exercise H:Shoulder Abduction 1. Sit in a stable chair without armrests, or stand. 2. Hold a __________ weight in your left / right hand, or hold an exercise band with both hands. 3. Start with your arms straight down and your left / right palm facing in, toward your body. 4. Slowly lift your left / right hand out to your side. Do not lift your hand above shoulder height unless your health care provider tells you that this is safe. ? Keep your arms  straight. ? Avoid shrugging your shoulder while you do this movement. Keep your shoulder blade tucked down toward the middle of your back. 5. Hold for __________ seconds. 6. Slowly lower your arm, and return to the starting position. Repeat __________ times. Complete this exercise __________ times a day. Exercise I:Shoulder Extension 1. Sit in a stable chair without armrests, or stand. 2. Secure an exercise band to a stable object in front of you where it is at shoulder height. 3. Hold one end of the exercise band in each hand. Your palms should face each other. 4. Straighten your elbows and lift your hands up to shoulder height. 5. Step back, away from the secured end of the exercise band, until the band is tight and there is no slack. 6. Squeeze your shoulder blades together as you pull your hands down to the sides of your thighs. Stop when your hands are straight down by your sides. Do not let your hands go behind your body. 7. Hold for __________ seconds. 8. Slowly return to the starting position. Repeat __________ times. Complete this exercise __________ times a day. Exercise J:Standing Shoulder Row 1. Sit in a stable chair without armrests, or stand. 2. Secure an exercise band to a stable object in front of you so it is at waist height. 3. Hold one end of the exercise band in each hand. Your palms should be in a thumbs-up position. 4. Bend each of your   elbows to an "L" shape (about 90 degrees) and keep your upper arms at your sides. 5. Step back until the band is tight and there is no slack. 6. Slowly pull your elbows back behind you. 7. Hold for __________ seconds. 8. Slowly return to the starting position. Repeat __________ times. Complete this exercise __________ times a day. Exercise K:Shoulder Press-Ups 1. Sit in a stable chair that has armrests. Sit upright, with your feet flat on the floor. 2. Put your hands on the armrests so your elbows are bent and your fingers are  pointing forward. Your hands should be about even with the sides of your body. 3. Push down on the armrests and use your arms to lift yourself off of the chair. Straighten your elbows and lift yourself up as much as you comfortably can. ? Move your shoulder blades down, and avoid letting your shoulders move up toward your ears. ? Keep your feet on the ground. As you get stronger, your feet should support less of your body weight as you lift yourself up. 4. Hold for __________ seconds. 5. Slowly lower yourself back into the chair. Repeat __________ times. Complete this exercise __________ times a day. Exercise L: Wall Push-Ups 1. Stand so you are facing a stable wall. Your feet should be about one arm-length away from the wall. 2. Lean forward and place your palms on the wall at shoulder height. 3. Keep your feet flat on the floor as you bend your elbows and lean forward toward the wall. 4. Hold for __________ seconds. 5. Straighten your elbows to push yourself back to the starting position. Repeat __________ times. Complete this exercise __________ times a day. This information is not intended to replace advice given to you by your health care provider. Make sure you discuss any questions you have with your health care provider. Document Released: 02/06/2005 Document Revised: 07/29/2017 Document Reviewed: 12/04/2014 Elsevier Interactive Patient Education  2019 [employer]  

## 2018-05-01 NOTE — Telephone Encounter (Signed)
Please apply for bilateral knee visco, per Dr. Deveshwar. Thanks!  

## 2018-05-06 NOTE — Telephone Encounter (Signed)
Noted  

## 2018-05-25 ENCOUNTER — Telehealth (INDEPENDENT_AMBULATORY_CARE_PROVIDER_SITE_OTHER): Payer: Self-pay

## 2018-05-25 NOTE — Telephone Encounter (Signed)
Submitted VOB for Visco-3, bilateral knee. 

## 2018-05-28 DIAGNOSIS — Z09 Encounter for follow-up examination after completed treatment for conditions other than malignant neoplasm: Secondary | ICD-10-CM | POA: Diagnosis not present

## 2018-05-28 DIAGNOSIS — Z1211 Encounter for screening for malignant neoplasm of colon: Secondary | ICD-10-CM | POA: Diagnosis not present

## 2018-05-28 DIAGNOSIS — Z86010 Personal history of colonic polyps: Secondary | ICD-10-CM | POA: Diagnosis not present

## 2018-05-28 DIAGNOSIS — M797 Fibromyalgia: Secondary | ICD-10-CM | POA: Diagnosis not present

## 2018-06-03 ENCOUNTER — Telehealth (INDEPENDENT_AMBULATORY_CARE_PROVIDER_SITE_OTHER): Payer: Self-pay

## 2018-06-03 NOTE — Telephone Encounter (Signed)
Please schedule patient an appointment with Dr. Corliss Skains or Ladona Ridgel for gel injection.  Thank You.  Patient is Approved for Visco 3 series, Bilateral knee Buy & Bill Patient will be responsible for 20% OOP. Co-pay of $40.00 PA required PA Approval #[id number] Valid 06/01/2018- 08/30/2018

## 2018-06-04 NOTE — Telephone Encounter (Signed)
Tried to contact patient to schedule injections.  No answering machine on her home # and voicemail not set up on her cell #.

## 2018-06-08 ENCOUNTER — Telehealth (INDEPENDENT_AMBULATORY_CARE_PROVIDER_SITE_OTHER): Payer: Self-pay

## 2018-06-08 DIAGNOSIS — Z6837 Body mass index (BMI) 37.0-37.9, adult: Secondary | ICD-10-CM | POA: Diagnosis not present

## 2018-06-08 DIAGNOSIS — J069 Acute upper respiratory infection, unspecified: Secondary | ICD-10-CM | POA: Diagnosis not present

## 2018-06-08 NOTE — Telephone Encounter (Signed)
Talked with patient concerning Gel injection.  Patient stated that she would like to hold off on getting gel injection at this time.

## 2018-06-08 NOTE — Telephone Encounter (Signed)
Patient returned our call stating "she is on a fixed income and is unable to pay the $40 copay for the gel injections."  Patient was told that it was a one time co-pay, but patient states the "Voltaren Gel is working just fine right now" and states she doesn't want to schedule at this time.  Patient was told the approval was only valid until 08/30/18.  Patient had additional questions and was transferred to April Jackson.

## 2018-10-08 NOTE — Progress Notes (Deleted)
Office Visit Note  Patient: [name]             Date of Birth: [birth year]           MRN: [id number]             PCP: Manon Hilding, MD Referring: Manon Hilding, MD Visit Date: 10/22/2018 Occupation: @GUAROCC @  Subjective:  No chief complaint on file.   History of Present Illness: [name] is a 63 y.o. female ***   Activities of Daily Living:  Patient reports morning stiffness for *** {minute/hour:19697}.   Patient {ACTIONS;DENIES/REPORTS:21021675::"Denies"} nocturnal pain.  Difficulty dressing/grooming: {ACTIONS;DENIES/REPORTS:21021675::"Denies"} Difficulty climbing stairs: {ACTIONS;DENIES/REPORTS:21021675::"Denies"} Difficulty getting out of chair: {ACTIONS;DENIES/REPORTS:21021675::"Denies"} Difficulty using hands for taps, buttons, cutlery, and/or writing: {ACTIONS;DENIES/REPORTS:21021675::"Denies"}  No Rheumatology ROS completed.   PMFS History:  Patient Active Problem List   Diagnosis Date Noted  . Dyslipidemia 07/22/2016  . Fibromyalgia 07/16/2016  . Other fatigue 07/16/2016  . Primary insomnia 07/16/2016  . DJD (degenerative joint disease), cervical 07/16/2016  . Spondylosis of lumbar region without myelopathy or radiculopathy 07/16/2016  . Primary osteoarthritis of both knees 07/16/2016  . Pain in thoracic spine 07/16/2016  . Primary osteoarthritis of both hands 07/16/2016  . Adhesive capsulitis of left shoulder 07/16/2016  . Restless leg syndrome 07/16/2016    Past Medical History:  Diagnosis Date  . DDD (degenerative disc disease), cervical   . Fibromyositis     No family history on file. Past Surgical History:  Procedure Laterality Date  . ABDOMINAL HYSTERECTOMY    . C-SPINE    . KNEE ARTHROPLASTY    . TOTAL SHOULDER ARTHROPLASTY    . TUBAL LIGATION     Social History   Social History Narrative  . Not on file    There is no immunization history on file for this patient.   Objective: Vital Signs: There were no vitals  taken for this visit.   Physical Exam   Musculoskeletal Exam: ***  CDAI Exam: CDAI Score: - Patient Global: -; Provider Global: - Swollen: -; Tender: - Joint Exam   No joint exam has been documented for this visit   There is currently no information documented on the homunculus. Go to the Rheumatology activity and complete the homunculus joint exam.  Investigation: No additional findings.  Imaging: No results found.  Recent Labs: Lab Results  Component Value Date   WBC 6.3 06/07/2009   HGB 12.2 06/07/2009   PLT 246 06/07/2009   NA 138 07/26/2016   K 4.3 07/26/2016   CL 104 07/26/2016   CO2 26 07/26/2016   GLUCOSE 98 07/26/2016   BUN 18 07/26/2016   CREATININE 0.93 07/26/2016   BILITOT 0.6 07/26/2016   ALKPHOS 68 07/26/2016   AST 18 07/26/2016   ALT 13 07/26/2016   PROT 7.0 07/26/2016   ALBUMIN 4.1 07/26/2016   CALCIUM 10.0 07/26/2016   GFRAA 77 07/26/2016    Speciality Comments: No specialty comments available.  Procedures:  No procedures performed Allergies: Patient has no known allergies.   Assessment / Plan:     Visit Diagnoses: No diagnosis found.    Dyslipidemia  History of depression  Restless leg syndrome   Orders: No orders of the defined types were placed in this encounter.  No orders of the defined types were placed in this encounter.   Face-to-face time spent with patient was *** minutes. Greater than 50% of time was spent in counseling and coordination of care.  Follow-Up Instructions: No  follow-ups on file.   Earnestine Mealing, CMA  Note - This record has been created using Editor, commissioning.  Chart creation errors have been sought, but may not always  have been located. Such creation errors do not reflect on  the standard of medical care.

## 2018-10-22 ENCOUNTER — Ambulatory Visit: Payer: Self-pay | Admitting: Rheumatology

## 2018-10-22 NOTE — Progress Notes (Deleted)
Office Visit Note  Patient: [name]             Date of Birth: 1955-11-20           MRN: [id number]             PCP: Manon Hilding, MD Referring: Manon Hilding, MD Visit Date: 11/05/2018 Occupation: @GUAROCC @  Subjective:  No chief complaint on file.   History of Present Illness: [name] is a 62 y.o. female ***   Activities of Daily Living:  Patient reports morning stiffness for *** {minute/hour:19697}.   Patient {ACTIONS;DENIES/REPORTS:21021675::"Denies"} nocturnal pain.  Difficulty dressing/grooming: {ACTIONS;DENIES/REPORTS:21021675::"Denies"} Difficulty climbing stairs: {ACTIONS;DENIES/REPORTS:21021675::"Denies"} Difficulty getting out of chair: {ACTIONS;DENIES/REPORTS:21021675::"Denies"} Difficulty using hands for taps, buttons, cutlery, and/or writing: {ACTIONS;DENIES/REPORTS:21021675::"Denies"}  No Rheumatology ROS completed.   PMFS History:  Patient Active Problem List   Diagnosis Date Noted  . Dyslipidemia 07/22/2016  . Fibromyalgia 07/16/2016  . Other fatigue 07/16/2016  . Primary insomnia 07/16/2016  . DJD (degenerative joint disease), cervical 07/16/2016  . Spondylosis of lumbar region without myelopathy or radiculopathy 07/16/2016  . Primary osteoarthritis of both knees 07/16/2016  . Pain in thoracic spine 07/16/2016  . Primary osteoarthritis of both hands 07/16/2016  . Adhesive capsulitis of left shoulder 07/16/2016  . Restless leg syndrome 07/16/2016    Past Medical History:  Diagnosis Date  . DDD (degenerative disc disease), cervical   . Fibromyositis     No family history on file. Past Surgical History:  Procedure Laterality Date  . ABDOMINAL HYSTERECTOMY    . C-SPINE    . KNEE ARTHROPLASTY    . TOTAL SHOULDER ARTHROPLASTY    . TUBAL LIGATION     Social History   Social History Narrative  . Not on file    There is no immunization history on file for this patient.   Objective: Vital Signs: There were no vitals  taken for this visit.   Physical Exam   Musculoskeletal Exam: ***  CDAI Exam: CDAI Score: - Patient Global: -; Provider Global: - Swollen: -; Tender: - Joint Exam   No joint exam has been documented for this visit   There is currently no information documented on the homunculus. Go to the Rheumatology activity and complete the homunculus joint exam.  Investigation: No additional findings.  Imaging: No results found.  Recent Labs: Lab Results  Component Value Date   WBC 6.3 06/07/2009   HGB 12.2 06/07/2009   PLT 246 06/07/2009   NA 138 07/26/2016   K 4.3 07/26/2016   CL 104 07/26/2016   CO2 26 07/26/2016   GLUCOSE 98 07/26/2016   BUN 18 07/26/2016   CREATININE 0.93 07/26/2016   BILITOT 0.6 07/26/2016   ALKPHOS 68 07/26/2016   AST 18 07/26/2016   ALT 13 07/26/2016   PROT 7.0 07/26/2016   ALBUMIN 4.1 07/26/2016   CALCIUM 10.0 07/26/2016   GFRAA 77 07/26/2016    Speciality Comments: No specialty comments available.  Procedures:  No procedures performed Allergies: Patient has no known allergies.   Assessment / Plan:     Visit Diagnoses: No diagnosis found.  Orders: No orders of the defined types were placed in this encounter.  No orders of the defined types were placed in this encounter.   Face-to-face time spent with patient was *** minutes. Greater than 50% of time was spent in counseling and coordination of care.  Follow-Up Instructions: No follow-ups on file.   Earnestine Mealing, CMA  Note - This  record has been created using [employer].  Chart creation errors have been sought, but may not always  have been located. Such creation errors do not reflect on  the standard of medical care.

## 2018-11-05 ENCOUNTER — Ambulatory Visit: Payer: Self-pay | Admitting: Rheumatology

## 2018-11-11 NOTE — Progress Notes (Deleted)
Office Visit Note  Patient: [name]             Date of Birth: [birth year]           MRN: [id number]             PCP: Manon Hilding, MD Referring: Manon Hilding, MD Visit Date: 11/25/2018 Occupation: @GUAROCC @  Subjective:  No chief complaint on file.   History of Present Illness: [name] is a 63 y.o. female ***   Activities of Daily Living:  Patient reports morning stiffness for *** {minute/hour:19697}.   Patient {ACTIONS;DENIES/REPORTS:21021675::"Denies"} nocturnal pain.  Difficulty dressing/grooming: {ACTIONS;DENIES/REPORTS:21021675::"Denies"} Difficulty climbing stairs: {ACTIONS;DENIES/REPORTS:21021675::"Denies"} Difficulty getting out of chair: {ACTIONS;DENIES/REPORTS:21021675::"Denies"} Difficulty using hands for taps, buttons, cutlery, and/or writing: {ACTIONS;DENIES/REPORTS:21021675::"Denies"}  No Rheumatology ROS completed.   PMFS History:  Patient Active Problem List   Diagnosis Date Noted  . Dyslipidemia 07/22/2016  . Fibromyalgia 07/16/2016  . Other fatigue 07/16/2016  . Primary insomnia 07/16/2016  . DJD (degenerative joint disease), cervical 07/16/2016  . Spondylosis of lumbar region without myelopathy or radiculopathy 07/16/2016  . Primary osteoarthritis of both knees 07/16/2016  . Pain in thoracic spine 07/16/2016  . Primary osteoarthritis of both hands 07/16/2016  . Adhesive capsulitis of left shoulder 07/16/2016  . Restless leg syndrome 07/16/2016    Past Medical History:  Diagnosis Date  . DDD (degenerative disc disease), cervical   . Fibromyositis     No family history on file. Past Surgical History:  Procedure Laterality Date  . ABDOMINAL HYSTERECTOMY    . C-SPINE    . KNEE ARTHROPLASTY    . TOTAL SHOULDER ARTHROPLASTY    . TUBAL LIGATION     Social History   Social History Narrative  . Not on file    There is no immunization history on file for this patient.   Objective: Vital Signs: There were no vitals  taken for this visit.   Physical Exam   Musculoskeletal Exam: ***  CDAI Exam: CDAI Score: - Patient Global: -; Provider Global: - Swollen: -; Tender: - Joint Exam   No joint exam has been documented for this visit   There is currently no information documented on the homunculus. Go to the Rheumatology activity and complete the homunculus joint exam.  Investigation: No additional findings.  Imaging: No results found.  Recent Labs: Lab Results  Component Value Date   WBC 6.3 06/07/2009   HGB 12.2 06/07/2009   PLT 246 06/07/2009   NA 138 07/26/2016   K 4.3 07/26/2016   CL 104 07/26/2016   CO2 26 07/26/2016   GLUCOSE 98 07/26/2016   BUN 18 07/26/2016   CREATININE 0.93 07/26/2016   BILITOT 0.6 07/26/2016   ALKPHOS 68 07/26/2016   AST 18 07/26/2016   ALT 13 07/26/2016   PROT 7.0 07/26/2016   ALBUMIN 4.1 07/26/2016   CALCIUM 10.0 07/26/2016   GFRAA 77 07/26/2016    Speciality Comments: No specialty comments available.  Procedures:  No procedures performed Allergies: Patient has no known allergies.   Assessment / Plan:     Visit Diagnoses: No diagnosis found.  Orders: No orders of the defined types were placed in this encounter.  No orders of the defined types were placed in this encounter.   Face-to-face time spent with patient was *** minutes. Greater than 50% of time was spent in counseling and coordination of care.  Follow-Up Instructions: No follow-ups on file.   Earnestine Mealing, CMA  Note - This  record has been created using [employer].  Chart creation errors have been sought, but may not always  have been located. Such creation errors do not reflect on  the standard of medical care.

## 2018-11-25 ENCOUNTER — Ambulatory Visit: Payer: Self-pay | Admitting: Rheumatology

## 2018-11-25 NOTE — Progress Notes (Signed)
Office Visit Note  Patient: [name]             Date of Birth: 01/20/1956           MRN: [id number]             PCP: Estanislado PandySasser, Paul W, MD Referring: Estanislado PandySasser, Paul W, MD Visit Date: 12/09/2018 Occupation: @GUAROCC @  Subjective:  Anxiety   History of Present Illness: [name] is a 63 y.o. female with history of fibromyalgia, osteoarthritis, and DDD. She states she continues to have generalized muscle aches and muscle tenderness due to fibromyalgia. She has trapezius muscle tension and tenderness, and she would like trigger point injections today. She states weather changes and stress seem to be a trigger for flares.   She takes Baclofen 10 mg PRN for muscle spasms and flexeril 10 mg po at bedtime prn.  She takes these very sparingly. She has been having increased anxiety and irritability.   She states she has intermittent pain in both knee joints.  She denies any knee joint warmth or swelling.  She has occasional pain in both hands.    Activities of Daily Living:  Patient reports morning stiffness for 1 hour.   Patient Reports nocturnal pain.  Difficulty dressing/grooming: Denies Difficulty climbing stairs: Reports Difficulty getting out of chair: Reports Difficulty using hands for taps, buttons, cutlery, and/or writing: Denies  Review of Systems  Constitutional: Negative for fatigue.  HENT: Negative for mouth sores, mouth dryness and nose dryness.   Eyes: Negative for pain, itching, visual disturbance and dryness.  Respiratory: Negative for cough, hemoptysis, shortness of breath, wheezing and difficulty breathing.   Cardiovascular: Negative for chest pain, palpitations, hypertension and swelling in legs/feet.  Gastrointestinal: Negative for blood in stool, constipation and diarrhea.  Endocrine: Negative for increased urination.  Genitourinary: Negative for painful urination.  Musculoskeletal: Positive for arthralgias, joint pain, joint swelling, morning stiffness  and muscle tenderness. Negative for myalgias, muscle weakness and myalgias.  Skin: Negative for color change, pallor, rash, hair loss, nodules/bumps, redness, skin tightness, ulcers and sensitivity to sunlight.  Allergic/Immunologic: Negative for susceptible to infections.  Neurological: Negative for dizziness, light-headedness, numbness, headaches, memory loss and weakness.  Hematological: Negative for swollen glands.  Psychiatric/Behavioral: Negative for depressed mood, confusion and sleep disturbance. The patient is nervous/anxious.     PMFS History:  Patient Active Problem List   Diagnosis Date Noted   Dyslipidemia 07/22/2016   Fibromyalgia 07/16/2016   Other fatigue 07/16/2016   Primary insomnia 07/16/2016   DJD (degenerative joint disease), cervical 07/16/2016   Spondylosis of lumbar region without myelopathy or radiculopathy 07/16/2016   Primary osteoarthritis of both knees 07/16/2016   Pain in thoracic spine 07/16/2016   Primary osteoarthritis of both hands 07/16/2016   Adhesive capsulitis of left shoulder 07/16/2016   Restless leg syndrome 07/16/2016    Past Medical History:  Diagnosis Date   DDD (degenerative disc disease), cervical    Fibromyositis     History reviewed. No pertinent family history. Past Surgical History:  Procedure Laterality Date   ABDOMINAL HYSTERECTOMY     C-SPINE     KNEE ARTHROPLASTY     TOTAL SHOULDER ARTHROPLASTY     TUBAL LIGATION     Social History   Social History Narrative   Not on file    There is no immunization history on file for this patient.   Objective: Vital Signs: BP 123/76 (BP Location: Left Arm, Patient Position: Sitting, Cuff Size: Large)  Pulse 75    Resp 15    Ht 5\' 7"  (1.702 m)    Wt 233 lb 9.6 oz (106 kg)    BMI 36.59 kg/m    Physical Exam Vitals signs and nursing note reviewed.  Constitutional:      Appearance: She is well-developed.  HENT:     Head: Normocephalic and atraumatic.  Eyes:      Conjunctiva/sclera: Conjunctivae normal.  Neck:     Musculoskeletal: Normal range of motion.  Cardiovascular:     Rate and Rhythm: Normal rate and regular rhythm.     Heart sounds: Normal heart sounds.  Pulmonary:     Effort: Pulmonary effort is normal.     Breath sounds: Normal breath sounds.  Abdominal:     General: Bowel sounds are normal.     Palpations: Abdomen is soft.  Lymphadenopathy:     Cervical: No cervical adenopathy.  Skin:    General: Skin is warm and dry.     Capillary Refill: Capillary refill takes less than 2 seconds.  Neurological:     Mental Status: She is alert and oriented to person, place, and time.  Psychiatric:        Behavior: Behavior normal.      Musculoskeletal Exam: Generalized hyperalgesia and positive tender points.  C-spine, thoracic spine, and lumbar spine good ROM.  No midline spinal tenderness.  No SI joint tenderness.  Shoulder joints, elbow joints, wrist joints, MCPs, PIPs, and DIPs good ROM with no synovitis.  PIP and DIP synovial thickening consistent with osteoarthritis of both hands.  Hip joints, knee joints, ankle joints, MTPs, PIPs, and DIPs good ROM with no synovitis.  No warmth or effusion of knee joints.  No tenderness or swelling of ankle joints.    CDAI Exam: CDAI Score: -- Patient Global: --; Provider Global: -- Swollen: --; Tender: -- Joint Exam   No joint exam has been documented for this visit   There is currently no information documented on the homunculus. Go to the Rheumatology activity and complete the homunculus joint exam.  Investigation: No additional findings.  Imaging: No results found.  Recent Labs: Lab Results  Component Value Date   WBC 6.3 06/07/2009   HGB 12.2 06/07/2009   PLT 246 06/07/2009   NA 138 07/26/2016   K 4.3 07/26/2016   CL 104 07/26/2016   CO2 26 07/26/2016   GLUCOSE 98 07/26/2016   BUN 18 07/26/2016   CREATININE 0.93 07/26/2016   BILITOT 0.6 07/26/2016   ALKPHOS 68 07/26/2016    AST 18 07/26/2016   ALT 13 07/26/2016   PROT 7.0 07/26/2016   ALBUMIN 4.1 07/26/2016   CALCIUM 10.0 07/26/2016   GFRAA 77 07/26/2016    Speciality Comments: No specialty comments available.  Procedures:  Trigger Point Inj  Date/Time: 12/09/2018 4:29 PM Performed by: Bo Merino, MD Authorized by: Bo Merino, MD   Consent Given by:  Patient Site marked: the procedure site was marked   Timeout: prior to procedure the correct patient, procedure, and site was verified   Indications:  Pain Total # of Trigger Points:  2 Location: neck   Needle Size:  27 G Approach:  Dorsal Medications #1:  0.5 mL lidocaine 1 %; 10 mg triamcinolone acetonide 40 MG/ML Medications #2:  0.5 mL lidocaine 1 %; 10 mg triamcinolone acetonide 40 MG/ML Patient tolerance:  Patient tolerated the procedure well with no immediate complications   Allergies: Patient has no known allergies.   Assessment / Plan:  Visit Diagnoses: Fibromyalgia:  She has generalized hyperalgesia and positive tender points on exam.  She has generalized muscle aches and muscle tenderness.  She has been experiencing increased myalgias related to frequent weather changes and increased stress.  She has noticed that her anxiety and irritability have been worsening.  In the past she has tried taking Cymbalta, Effexor, gabapentin, and Lyrica.  She could not tolerate these medications due to side effects.  We discussed potential medication options at length.  She would like to try Lexapro 5 mg 1 tablet by mouth daily.  Indications, contraindications, potential side effects were discussed.  All questions were addressed.  She will follow-up in the office in 1 month to assess how she is doing.  She was advised to notify us if she develops any side effects.  She was encouraged to stay active and exercise on a regular basis.  Other fatigue: She has chronic fatigue but it has been stable.   Primary insomnia: She has not had any difficulty  sleeping at night recently.  She occasionally takes Flexeril 10 mg po at bedtime for muscle spasms.    Primary osteoarthritis of both hands: She has PIP and DIP synovial thickening consistent with osteoarthritis of bilateral hands.  She has no tenderness or synovitis.  She has complete fist formation bilaterally.  Joint protection and muscle strengthening were discussed.   Primary osteoarthritis of both knees -X-rays of the left knee joint were obtained on 05/01/2018 which revealed moderate to severe osteoarthritis and mild chondromalacia patella.  X-rays of the right knee revealed severe osteoarthritis of moderate chondromalacia patella.  She experiences intermittent left knee joint pain.  She has good range of motion exam.  No warmth or effusion was noted.  Adhesive capsulitis of left shoulder - History of manipulation.  She has good range of motion with no discomfort at this time.  Trapezius muscle spasm: She has trapezius muscle tension and muscle tenderness bilaterally.  She has spasms intermittently.  She requested trigger point injections today.  She tolerated the procedure well.  We discussed using heating pad, massage, Biofreeze as needed.  We also discussed the importance of appropriate posture.  DDD (degenerative disc disease), cervical - Status post discectomy: She has limited ROM with discomfort.  No symptoms of radiculopathy.  She has trapezius muscle tension and tenderness. She requested trigger point injections today.   DDD (degenerative disc disease), thoracic: No midline spinal tenderness.    DDD lumbar: She has no lower back pain at this time.  She has no midline spinal tenderness.  She has no symptoms of radiculopathy at this time.   Anxiety: She has been having more frequent and severe anxiety recently.  She has been under increased stress at home.  She reports that in the past she has seen a psychiatrist but has not seen them recently.  She tried taking Cymbalta and Effexor in  the past but could not tolerate it due to side effects.  She would like to start on Lexapro 5 mg by mouth daily.  Side effects were reviewed.  She will follow-up in 1 month to assess how she is doing on low-dose Lexapro.  Irritability: She has been experiencing increased irritability. She will be starting on Lexapro 5 mg 1 tablet daily.   Other medical conditions are listed as follows:   Restless leg syndrome  Dyslipidemia  History of depression  Orders: Orders Placed This Encounter  Procedures   Trigger Point Inj   Meds ordered this encounter  Medications   escitalopram (LEXAPRO) 5 MG tablet    Sig: Take 1 tablet (5 mg total) by mouth daily.    Dispense:  30 tablet    Refill:  0    Face-to-face time spent with patient was 30 minutes. Greater than 50% of time was spent in counseling and coordination of care.  Follow-Up Instructions: Return in about 4 weeks (around 01/06/2019) for Fibromyalgia, Osteoarthritis, DDD.   Gearldine Bienenstockaylor M Lamari Youngers, PA-C   I examined and evaluated the patient with Sherron Alesaylor Daizha Anand PA.  Patient continues to have generalized pain and discomfort from fibromyalgia.  She also has some underlying osteoarthritis and degenerative disc disease which causes discomfort.  She is also having mood swings.  We discussed possible use of Lexapro for help her mood and also generalized pain.  After indications side effects contraindications were discussed she was given a prescription for Lexapro.  She also complains about the plan of care was discussed as noted above.  Pollyann SavoyShaili Deveshwar, MD  Note - This record has been created using [employer].  Chart creation errors have been sought, but may not always  have been located. Such creation errors do not reflect on  the standard of medical care.

## 2018-12-09 ENCOUNTER — Other Ambulatory Visit: Payer: Self-pay

## 2018-12-09 ENCOUNTER — Encounter: Payer: Self-pay | Admitting: Rheumatology

## 2018-12-09 ENCOUNTER — Ambulatory Visit: Payer: Medicare HMO | Admitting: Rheumatology

## 2018-12-09 VITALS — BP 123/76 | HR 75 | Resp 15 | Ht 67.0 in | Wt 233.6 lb

## 2018-12-09 DIAGNOSIS — M5030 Other cervical disc degeneration, unspecified cervical region: Secondary | ICD-10-CM

## 2018-12-09 DIAGNOSIS — F5101 Primary insomnia: Secondary | ICD-10-CM | POA: Diagnosis not present

## 2018-12-09 DIAGNOSIS — M797 Fibromyalgia: Secondary | ICD-10-CM | POA: Diagnosis not present

## 2018-12-09 DIAGNOSIS — E785 Hyperlipidemia, unspecified: Secondary | ICD-10-CM

## 2018-12-09 DIAGNOSIS — R454 Irritability and anger: Secondary | ICD-10-CM

## 2018-12-09 DIAGNOSIS — M19041 Primary osteoarthritis, right hand: Secondary | ICD-10-CM

## 2018-12-09 DIAGNOSIS — M7502 Adhesive capsulitis of left shoulder: Secondary | ICD-10-CM

## 2018-12-09 DIAGNOSIS — M62838 Other muscle spasm: Secondary | ICD-10-CM

## 2018-12-09 DIAGNOSIS — G2581 Restless legs syndrome: Secondary | ICD-10-CM

## 2018-12-09 DIAGNOSIS — R5383 Other fatigue: Secondary | ICD-10-CM | POA: Diagnosis not present

## 2018-12-09 DIAGNOSIS — M5134 Other intervertebral disc degeneration, thoracic region: Secondary | ICD-10-CM

## 2018-12-09 DIAGNOSIS — M170 Bilateral primary osteoarthritis of knee: Secondary | ICD-10-CM

## 2018-12-09 DIAGNOSIS — F419 Anxiety disorder, unspecified: Secondary | ICD-10-CM

## 2018-12-09 DIAGNOSIS — M47816 Spondylosis without myelopathy or radiculopathy, lumbar region: Secondary | ICD-10-CM

## 2018-12-09 DIAGNOSIS — M19042 Primary osteoarthritis, left hand: Secondary | ICD-10-CM

## 2018-12-09 DIAGNOSIS — Z8659 Personal history of other mental and behavioral disorders: Secondary | ICD-10-CM

## 2018-12-09 MED ORDER — TRIAMCINOLONE ACETONIDE 40 MG/ML IJ SUSP
10.0000 mg | INTRAMUSCULAR | Status: AC | PRN
  Administered 2018-12-09: 10 mg via INTRAMUSCULAR

## 2018-12-09 MED ORDER — LIDOCAINE HCL 1 % IJ SOLN
0.5000 mL | INTRAMUSCULAR | Status: AC | PRN
  Administered 2018-12-09: .5 mL

## 2018-12-09 MED ORDER — ESCITALOPRAM OXALATE 5 MG PO TABS: 5.0000 mg | ORAL_TABLET | Freq: Every day | ORAL | 0 refills | Status: DC

## 2018-12-09 NOTE — Progress Notes (Signed)
Pharmacy Note  Subjective:  Patient presents today to the [facility] to see Dr. Estanislado Pandy.   Patient seen by the pharmacist for counseling on Lexapro.  Objective: Current Outpatient Medications on File Prior to Visit  Medication Sig Dispense Refill  . aspirin EC 81 MG tablet Take 81 mg by mouth daily.     . baclofen (LIORESAL) 10 MG tablet Take 10 mg by mouth daily as needed.     Marland Kitchen BLACK CURRANT SEED OIL PO Take by mouth daily.    . Cholecalciferol (VITAMIN D3) 125 MCG (5000 UT) CAPS Take by mouth every other day.    . cyclobenzaprine (FLEXERIL) 10 MG tablet Take 10 mg by mouth at bedtime as needed.     Marland Kitchen ibuprofen (ADVIL,MOTRIN) 800 MG tablet Take 600 mg by mouth as needed.     Marland Kitchen OVER THE COUNTER MEDICATION      No current facility-administered medications on file prior to visit.      Assessment/Plan:  Patient on the purpose, proper use, and adverse effects of Lexapro including headache, nausea, and drowsiness.  Counseled on the risk of hyponatremia and increased bleeding risk with concomitant NSAID use.  She takes NSAIDS only as needed.  Counseled on risk of sertonin syndrome and to seek urgent medical care if she has increased heart rate, sweating, and muscle cramps/twitching.  Patient verbalized understanding.  Patient given education handout.  She will be starting on low dose Lexapro 5 mg 1 tablet daily as she is nervous about side effects.  All questions encouraged and answered.  Instructed patient to call with any questions or concerns.  Mariella Saa, PharmD, [place], [place] [facility] Pharmacist [phone]  12/09/2018 3:46 PM

## 2018-12-23 NOTE — Progress Notes (Deleted)
Office Visit Note  Patient: [name]             Date of Birth: 05-20-1955           MRN: [id number]             PCP: Manon Hilding, MD Referring: Manon Hilding, MD Visit Date: 01/06/2019 Occupation: @GUAROCC @  Subjective:  No chief complaint on file.   History of Present Illness: [name] is a 63 y.o. female ***   Activities of Daily Living:  Patient reports morning stiffness for *** {minute/hour:19697}.   Patient {ACTIONS;DENIES/REPORTS:21021675::"Denies"} nocturnal pain.  Difficulty dressing/grooming: {ACTIONS;DENIES/REPORTS:21021675::"Denies"} Difficulty climbing stairs: {ACTIONS;DENIES/REPORTS:21021675::"Denies"} Difficulty getting out of chair: {ACTIONS;DENIES/REPORTS:21021675::"Denies"} Difficulty using hands for taps, buttons, cutlery, and/or writing: {ACTIONS;DENIES/REPORTS:21021675::"Denies"}  No Rheumatology ROS completed.   PMFS History:  Patient Active Problem List   Diagnosis Date Noted  . Dyslipidemia 07/22/2016  . Fibromyalgia 07/16/2016  . Other fatigue 07/16/2016  . Primary insomnia 07/16/2016  . DJD (degenerative joint disease), cervical 07/16/2016  . Spondylosis of lumbar region without myelopathy or radiculopathy 07/16/2016  . Primary osteoarthritis of both knees 07/16/2016  . Pain in thoracic spine 07/16/2016  . Primary osteoarthritis of both hands 07/16/2016  . Adhesive capsulitis of left shoulder 07/16/2016  . Restless leg syndrome 07/16/2016    Past Medical History:  Diagnosis Date  . DDD (degenerative disc disease), cervical   . Fibromyositis     No family history on file. Past Surgical History:  Procedure Laterality Date  . ABDOMINAL HYSTERECTOMY    . C-SPINE    . KNEE ARTHROPLASTY    . TOTAL SHOULDER ARTHROPLASTY    . TUBAL LIGATION     Social History   Social History Narrative  . Not on file    There is no immunization history on file for this patient.   Objective: Vital Signs: There were no vitals  taken for this visit.   Physical Exam   Musculoskeletal Exam: ***  CDAI Exam: CDAI Score: - Patient Global: -; Provider Global: - Swollen: -; Tender: - Joint Exam   No joint exam has been documented for this visit   There is currently no information documented on the homunculus. Go to the Rheumatology activity and complete the homunculus joint exam.  Investigation: No additional findings.  Imaging: No results found.  Recent Labs: Lab Results  Component Value Date   WBC 6.3 06/07/2009   HGB 12.2 06/07/2009   PLT 246 06/07/2009   NA 138 07/26/2016   K 4.3 07/26/2016   CL 104 07/26/2016   CO2 26 07/26/2016   GLUCOSE 98 07/26/2016   BUN 18 07/26/2016   CREATININE 0.93 07/26/2016   BILITOT 0.6 07/26/2016   ALKPHOS 68 07/26/2016   AST 18 07/26/2016   ALT 13 07/26/2016   PROT 7.0 07/26/2016   ALBUMIN 4.1 07/26/2016   CALCIUM 10.0 07/26/2016   GFRAA 77 07/26/2016    Speciality Comments: No specialty comments available.  Procedures:  No procedures performed Allergies: Patient has no known allergies.   Assessment / Plan:     Visit Diagnoses: No diagnosis found.  Orders: No orders of the defined types were placed in this encounter.  No orders of the defined types were placed in this encounter.   Face-to-face time spent with patient was *** minutes. Greater than 50% of time was spent in counseling and coordination of care.  Follow-Up Instructions: No follow-ups on file.   Earnestine Mealing, CMA  Note - This  record has been created using [employer].  Chart creation errors have been sought, but may not always  have been located. Such creation errors do not reflect on  the standard of medical care.

## 2019-01-04 ENCOUNTER — Telehealth: Payer: Self-pay | Admitting: Rheumatology

## 2019-01-04 NOTE — Telephone Encounter (Signed)
Thank you for informing me.

## 2019-01-04 NOTE — Telephone Encounter (Signed)
Patient called to let Dr. Estanislado Pandy know the Lexapro made her feel like she didn't have any energy to get off the couch so she discontinued taking the medication.  Please advise

## 2019-01-04 NOTE — Telephone Encounter (Signed)
Patient states she was given a prescription for Lexapro on 12/09/18. Patient states she took the medication for approximately 2 days. Patient states she felt "tired and drained." Patient states she stopped the medication. Patient states she had a similar reaction to Celexa. Patient states she is now trying breathing exercises and walking. Patient states she does not want to try another medication.

## 2019-01-06 ENCOUNTER — Ambulatory Visit: Payer: Medicare HMO | Admitting: Rheumatology

## 2019-06-29 NOTE — Progress Notes (Signed)
Office Visit Note  Patient: [name]             Date of Birth: 12-May-1955           MRN: [id number]             PCP: Manon Hilding, MD Referring: Manon Hilding, MD Visit Date: 06/30/2019 Occupation: @GUAROCC @  Subjective:  Pain in both knees  History of Present Illness: [name] is a 64 y.o. female with history of fibromyalgia, osteoarthritis, and DDD.  She continues have generalized myalgias and muscle tenderness due to fibromyalgia.  She has been having more frequent flares recently.  She has trapezius muscle tension and muscle tenderness today.  She states she is also having discomfort in her right shoulder after receiving the COVID-19 vaccination on Saturday.  She is also having discomfort in the left shoulder which had a history of adhesive capsulitis.  She has ongoing pain in bilateral knee joints and would like to reapply for Visco gel injections which have been effective in the past.  She denies any joint swelling in her knee joints at this time.  She states she is having increased lower back pain and has seen Dr. Louanne Skye in the past and plans on following up with him for further evaluation.  She has been taking baclofen 10 mg by mouth as needed for muscle spasms and Flexeril 10 mg by mouth at bedtime as needed for muscle spasms.   Activities of Daily Living:  Patient reports morning stiffness for several hours.   Patient Reports nocturnal pain.  Difficulty dressing/grooming: Denies Difficulty climbing stairs: Reports Difficulty getting out of chair: Reports Difficulty using hands for taps, buttons, cutlery, and/or writing: Reports  Review of Systems  Constitutional: Positive for fatigue.  HENT: Negative for mouth sores, mouth dryness and nose dryness.   Eyes: Negative for pain, itching, visual disturbance and dryness.  Respiratory: Negative for cough, hemoptysis, shortness of breath and difficulty breathing.   Cardiovascular: Negative for chest pain,  palpitations, hypertension and swelling in legs/feet.  Gastrointestinal: Negative for blood in stool, constipation and diarrhea.  Endocrine: Negative for increased urination.  Genitourinary: Negative for difficulty urinating and painful urination.  Musculoskeletal: Positive for arthralgias, joint pain, joint swelling, myalgias, muscle weakness, morning stiffness, muscle tenderness and myalgias.  Skin: Negative for color change, pallor, rash, hair loss, nodules/bumps, redness, skin tightness, ulcers and sensitivity to sunlight.  Allergic/Immunologic: Negative for susceptible to infections.  Neurological: Positive for headaches. Negative for dizziness and numbness.  Hematological: Negative for swollen glands.  Psychiatric/Behavioral: Positive for sleep disturbance. Negative for depressed mood and confusion. The patient is not nervous/anxious.     PMFS History:  Patient Active Problem List   Diagnosis Date Noted  . Dyslipidemia 07/22/2016  . Fibromyalgia 07/16/2016  . Other fatigue 07/16/2016  . Primary insomnia 07/16/2016  . DJD (degenerative joint disease), cervical 07/16/2016  . Spondylosis of lumbar region without myelopathy or radiculopathy 07/16/2016  . Primary osteoarthritis of both knees 07/16/2016  . Pain in thoracic spine 07/16/2016  . Primary osteoarthritis of both hands 07/16/2016  . Adhesive capsulitis of left shoulder 07/16/2016  . Restless leg syndrome 07/16/2016    Past Medical History:  Diagnosis Date  . DDD (degenerative disc disease), cervical   . Fibromyositis     History reviewed. No pertinent family history. Past Surgical History:  Procedure Laterality Date  . ABDOMINAL HYSTERECTOMY    . C-SPINE    . KNEE ARTHROPLASTY    .  TOTAL SHOULDER ARTHROPLASTY    . TUBAL LIGATION     Social History   Social History Narrative  . Not on file    There is no immunization history on file for this patient.   Objective: Vital Signs: BP (!) 150/82 (BP Location:  Right Wrist, Patient Position: Sitting, Cuff Size: Normal)   Pulse 68   Resp 15   Ht 5\' 7"  (1.702 m)   Wt 236 lb 12.8 oz (107.4 kg)   BMI 37.09 kg/m    Physical Exam Vitals and nursing note reviewed.  Constitutional:      Appearance: She is well-developed.  HENT:     Head: Normocephalic and atraumatic.  Eyes:     Conjunctiva/sclera: Conjunctivae normal.  Pulmonary:     Effort: Pulmonary effort is normal.  Abdominal:     General: Bowel sounds are normal.     Palpations: Abdomen is soft.  Musculoskeletal:     Cervical back: Normal range of motion.  Lymphadenopathy:     Cervical: No cervical adenopathy.  Skin:    General: Skin is warm and dry.     Capillary Refill: Capillary refill takes less than 2 seconds.  Neurological:     Mental Status: She is alert and oriented to person, place, and time.  Psychiatric:        Behavior: Behavior normal.      Musculoskeletal Exam: Generalized hyperalgesia and positive tender points on exam.  C-spine is slightly limited range of motion with lateral rotation.  Trapezius muscle spasms bilaterally.  Right shoulder is full range of motion with some discomfort.  Left shoulder has slightly limited abduction to about 160 degrees.  She has limited internal rotation of both shoulders.  Elbow joints have good range of motion with no tenderness or inflammation.  Wrist joints, MCPs, PIPs and DIPs good range of motion with no synovitis.  She has complete fist formation bilaterally.  Hip joints have good range of motion with no discomfort.  Knee joints have good range of motion with no warmth or effusion.  She has bilateral knee crepitus.  Ankle joints have good range of motion no tenderness or inflammation.  CDAI Exam: CDAI Score: -- Patient Global: --; Provider Global: -- Swollen: --; Tender: -- Joint Exam 06/30/2019   No joint exam has been documented for this visit   There is currently no information documented on the homunculus. Go to the  Rheumatology activity and complete the homunculus joint exam.  Investigation: No additional findings.  Imaging: No results found.  Recent Labs: Lab Results  Component Value Date   WBC 6.3 06/07/2009   HGB 12.2 06/07/2009   PLT 246 06/07/2009   NA 138 07/26/2016   K 4.3 07/26/2016   CL 104 07/26/2016   CO2 26 07/26/2016   GLUCOSE 98 07/26/2016   BUN 18 07/26/2016   CREATININE 0.93 07/26/2016   BILITOT 0.6 07/26/2016   ALKPHOS 68 07/26/2016   AST 18 07/26/2016   ALT 13 07/26/2016   PROT 7.0 07/26/2016   ALBUMIN 4.1 07/26/2016   CALCIUM 10.0 07/26/2016   GFRAA 77 07/26/2016    Speciality Comments: No specialty comments available.  Procedures:  No procedures performed Allergies: Patient has no known allergies.   Assessment / Plan:     Visit Diagnoses: Fibromyalgia: She has generalized hyperalgesia and positive tender points on exam.  She has been having increased myalgias and muscle tenderness related to fibromyalgia.  She has been having more frequent and severe flares.  She  takes baclofen 10 mg daily as needed for muscle spasms and Flexeril 10 mg by mouth at bedtime as needed for muscle spasms.  She tries to take both medications very sparingly.  She will occasionally take Advil or Tylenol for pain relief.  She was curious about restarting on gabapentin, Lyrica, Cymbalta, Effexor in case 1 of these medications would help manage her fibromyalgia.  In the past she could not tolerate Effexor or Cymbalta due to side effects.  She is apprehensive to proceed with any of these medications due to the risk for weight gain and other side effects.  We discussed the importance of regular exercise and good sleep hygiene.  She was encouraged to perform water therapy and water exercises on a regular basis.  She will follow up in 6 months.  Primary insomnia - She has interrupted sleep at night due to nocturnal pain in her lower back.  She occasionally takes Flexeril 10 mg po at bedtime for  muscle spasms.    Other fatigue: Chronic and related to insomnia.   Primary osteoarthritis of both hands:  She has not had any discomfort or joint stiffness in her hands recently. She has no tenderness or synovitis on exam.  She has complete fist formation and full ROM of both wrist joints.  Joint protection and muscle strengthening were discussed.   Primary osteoarthritis of both knees - X-rays of the left knee joint revealed moderate to severe osteoarthritis and mild chondromalacia, right knee revealed severe osteoarthritis of moderate chondromalacia patella: She has chronic pain in both knee joints.  She has good range of motion with discomfort bilaterally.  No warmth or effusion was noted.  She has bilateral knee crepitus.  She would like to reapply for Visco gel injections which were effective in the past.  Adhesive capsulitis of left shoulder - History of manipulation: She has slightly limited abduction to 160 degrees.  She has painful limited internal rotation.  She was encouraged to continue to work on range of motion exercises.  Trapezius muscle spasm: She has trapezius muscle tension and muscle tenderness bilaterally.  She spasms intermittently.  She takes baclofen or Flexeril as needed for muscle spasms.  She requested trigger point injections but we decided to not proceed due to elevated blood pressure as well as her having her Covid vaccine less than 1 week ago.  She was encouraged to use lidocaine patches as needed.  DDD (degenerative disc disease), cervical - Status post discectomy: Chronic pain.  She has limited lateral rotation bilaterally.  She is not experiencing any symptoms of radiculopathy at this time.  She is having trapezius muscle tension and muscle tenderness bilaterally.  She experiences muscle spasms intermittently takes baclofen 10 mg by mouth as needed for muscle spasms.  DDD (degenerative disc disease), thoracic: No midline spinal tenderness.   DDD lumbar: Chronic  pain.  She has been experiencing increased lower back pain and stiffness recently.  She is not having any symptoms of radiculopathy.  She previously saw Dr. Otelia Sergeant, so she was advised to follow up with him for further evaluation.  She declined x-rays today.   Anxiety - She tried taking Cymbalta and Effexor in the past but could not tolerate it due to side effects.  She does not want to retry cymbalta at this time.   Other medical conditions are listed as follows:   Restless leg syndrome  History of depression  Dyslipidemia  Orders: No orders of the defined types were placed in this encounter.  No orders of the defined types were placed in this encounter.   Face-to-face time spent with patient was 30 minutes. Greater than 50% of time was spent in counseling and coordination of care.  Follow-Up Instructions: Return in about 6 months (around 12/31/2019) for Fibromyalgia, Osteoarthritis.   Gearldine Bienenstock, PA-C  Note - This record has been created using Dragon software.  Chart creation errors have been sought, but may not always  have been located. Such creation errors do not reflect on  the standard of medical care.

## 2019-06-30 ENCOUNTER — Encounter: Payer: Self-pay | Admitting: Physician Assistant

## 2019-06-30 ENCOUNTER — Encounter (INDEPENDENT_AMBULATORY_CARE_PROVIDER_SITE_OTHER): Payer: Self-pay

## 2019-06-30 ENCOUNTER — Ambulatory Visit (INDEPENDENT_AMBULATORY_CARE_PROVIDER_SITE_OTHER): Payer: Medicare HMO | Admitting: Physician Assistant

## 2019-06-30 ENCOUNTER — Telehealth: Payer: Self-pay

## 2019-06-30 ENCOUNTER — Other Ambulatory Visit: Payer: Self-pay

## 2019-06-30 VITALS — BP 150/82 | HR 68 | Resp 15 | Ht 67.0 in | Wt 236.8 lb

## 2019-06-30 DIAGNOSIS — M5030 Other cervical disc degeneration, unspecified cervical region: Secondary | ICD-10-CM

## 2019-06-30 DIAGNOSIS — E785 Hyperlipidemia, unspecified: Secondary | ICD-10-CM

## 2019-06-30 DIAGNOSIS — F5101 Primary insomnia: Secondary | ICD-10-CM | POA: Diagnosis not present

## 2019-06-30 DIAGNOSIS — M19042 Primary osteoarthritis, left hand: Secondary | ICD-10-CM

## 2019-06-30 DIAGNOSIS — G2581 Restless legs syndrome: Secondary | ICD-10-CM

## 2019-06-30 DIAGNOSIS — Z8659 Personal history of other mental and behavioral disorders: Secondary | ICD-10-CM

## 2019-06-30 DIAGNOSIS — R5383 Other fatigue: Secondary | ICD-10-CM | POA: Diagnosis not present

## 2019-06-30 DIAGNOSIS — M7502 Adhesive capsulitis of left shoulder: Secondary | ICD-10-CM

## 2019-06-30 DIAGNOSIS — M47816 Spondylosis without myelopathy or radiculopathy, lumbar region: Secondary | ICD-10-CM

## 2019-06-30 DIAGNOSIS — M797 Fibromyalgia: Secondary | ICD-10-CM

## 2019-06-30 DIAGNOSIS — R454 Irritability and anger: Secondary | ICD-10-CM

## 2019-06-30 DIAGNOSIS — M62838 Other muscle spasm: Secondary | ICD-10-CM

## 2019-06-30 DIAGNOSIS — M170 Bilateral primary osteoarthritis of knee: Secondary | ICD-10-CM

## 2019-06-30 DIAGNOSIS — M19041 Primary osteoarthritis, right hand: Secondary | ICD-10-CM | POA: Diagnosis not present

## 2019-06-30 DIAGNOSIS — M5134 Other intervertebral disc degeneration, thoracic region: Secondary | ICD-10-CM

## 2019-06-30 DIAGNOSIS — F419 Anxiety disorder, unspecified: Secondary | ICD-10-CM

## 2019-06-30 NOTE — Telephone Encounter (Signed)
Please apply for bilateral knee visco, per Taylor Dale, PA-C. Thanks!  

## 2019-07-01 NOTE — Telephone Encounter (Signed)
Submitted for VOB 07/01/2019.

## 2019-07-20 NOTE — Telephone Encounter (Signed)
Please call to schedule Visco knee injections.   Authorized for Synvisc series Bilateral Knees. [name] and [name] Authorization for Synvisc # [id number] Effective 07-15-2019 through 10-14-2019 Deductible does not apply  Insurance will cover 100% of injection after a $40.00 co pay each visit and 80% of medication charge. Marland Kitchen

## 2019-08-16 ENCOUNTER — Other Ambulatory Visit: Payer: Self-pay

## 2019-08-16 ENCOUNTER — Ambulatory Visit: Payer: Medicare HMO | Admitting: Physician Assistant

## 2019-08-16 DIAGNOSIS — M170 Bilateral primary osteoarthritis of knee: Secondary | ICD-10-CM

## 2019-08-16 MED ORDER — HYLAN G-F 20 16 MG/2ML IX SOSY
16.0000 mg | PREFILLED_SYRINGE | INTRA_ARTICULAR | Status: AC | PRN
  Administered 2019-08-16: 16 mg via INTRA_ARTICULAR

## 2019-08-16 MED ORDER — LIDOCAINE HCL 1 % IJ SOLN
1.5000 mL | INTRAMUSCULAR | Status: AC | PRN
  Administered 2019-08-16: 1.5 mL

## 2019-08-16 NOTE — Progress Notes (Signed)
   Procedure Note  Patient: [name]             Date of Birth: [birth year]           MRN: [id number]             Visit Date: 08/16/2019  Procedures: Visit Diagnoses:  1. Primary osteoarthritis of both knees    Synvisc #1 Bilateral knee joint injections  Large Joint Inj: bilateral knee on 08/16/2019 2:05 PM Indications: pain Details: 27 G 1.5 in needle, medial approach  Arthrogram: No  Medications (Right): 1.5 mL lidocaine 1 %; 16 mg Hylan 16 MG/2ML Aspirate (Right): 0 mL Medications (Left): 1.5 mL lidocaine 1 %; 16 mg Hylan 16 MG/2ML Aspirate (Left): 0 mL Outcome: tolerated well, no immediate complications Procedure, treatment alternatives, risks and benefits explained, specific risks discussed. Consent was given by the patient. Immediately prior to procedure a time out was called to verify the correct patient, procedure, equipment, support staff and site/side marked as required. Patient was prepped and draped in the usual sterile fashion.     Patient tolerated the procedure well. Aftercare was discussed.  Sherron Ales, PA-C

## 2019-08-23 ENCOUNTER — Ambulatory Visit: Payer: Medicare HMO | Admitting: Physician Assistant

## 2019-08-23 ENCOUNTER — Other Ambulatory Visit: Payer: Self-pay

## 2019-08-23 DIAGNOSIS — M170 Bilateral primary osteoarthritis of knee: Secondary | ICD-10-CM | POA: Diagnosis not present

## 2019-08-23 MED ORDER — HYLAN G-F 20 16 MG/2ML IX SOSY
16.0000 mg | PREFILLED_SYRINGE | INTRA_ARTICULAR | Status: AC | PRN
Start: 2019-08-23 — End: 2019-08-23
  Administered 2019-08-23: 16 mg via INTRA_ARTICULAR

## 2019-08-23 MED ORDER — HYLAN G-F 20 16 MG/2ML IX SOSY
16.0000 mg | PREFILLED_SYRINGE | INTRA_ARTICULAR | Status: AC | PRN
  Administered 2019-08-23: 16 mg via INTRA_ARTICULAR

## 2019-08-23 MED ORDER — LIDOCAINE HCL 1 % IJ SOLN
1.5000 mL | INTRAMUSCULAR | Status: AC | PRN
  Administered 2019-08-23: 1.5 mL

## 2019-08-23 NOTE — Progress Notes (Signed)
   Procedure Note  Patient: [name]             Date of Birth: 11/24/1955           MRN: [id number]             Visit Date: 08/23/2019  Procedures: Visit Diagnoses:  1. Primary osteoarthritis of both knees    Synvisc #2 bilateral knee joint injections  Large Joint Inj: bilateral knee on 08/23/2019 8:28 AM Indications: pain Details: 25 G 1.5 in needle, medial approach  Arthrogram: No  Medications (Right): 1.5 mL lidocaine 1 %; 16 mg Hylan 16 MG/2ML Aspirate (Right): 0 mL Medications (Left): 1.5 mL lidocaine 1 %; 16 mg Hylan 16 MG/2ML Aspirate (Left): 0 mL Outcome: tolerated well, no immediate complications Procedure, treatment alternatives, risks and benefits explained, specific risks discussed. Consent was given by the patient. Immediately prior to procedure a time out was called to verify the correct patient, procedure, equipment, support staff and site/side marked as required. Patient was prepped and draped in the usual sterile fashion.    Patient tolerated the procedure well. Aftercare was discussed.  Sherron Ales, PA-C

## 2019-08-30 ENCOUNTER — Ambulatory Visit: Payer: Medicare HMO | Admitting: Physician Assistant

## 2019-08-30 ENCOUNTER — Other Ambulatory Visit: Payer: Self-pay

## 2019-08-30 DIAGNOSIS — M170 Bilateral primary osteoarthritis of knee: Secondary | ICD-10-CM | POA: Diagnosis not present

## 2019-08-30 MED ORDER — HYLAN G-F 20 16 MG/2ML IX SOSY
16.0000 mg | PREFILLED_SYRINGE | INTRA_ARTICULAR | Status: AC | PRN
  Administered 2019-08-30: 16 mg via INTRA_ARTICULAR

## 2019-08-30 MED ORDER — LIDOCAINE HCL 1 % IJ SOLN
1.5000 mL | INTRAMUSCULAR | Status: AC | PRN
Start: 2019-08-30 — End: 2019-08-30
  Administered 2019-08-30: 1.5 mL

## 2019-08-30 MED ORDER — LIDOCAINE HCL 1 % IJ SOLN
1.5000 mL | INTRAMUSCULAR | Status: AC | PRN
  Administered 2019-08-30: 1.5 mL

## 2019-08-30 NOTE — Progress Notes (Addendum)
   Procedure Note  Patient: [name]             Date of Birth: Nov 23, 1955           MRN: [id number]             Visit Date: 08/30/2019  Procedures: Visit Diagnoses:  1. Primary osteoarthritis of both knees    Synvisc #3 Bilateral knee joint injections  Large Joint Inj: bilateral knee on 08/30/2019 2:37 PM Indications: pain Details: 25 G 1.5 in needle, medial approach  Arthrogram: No  Medications (Right): 1.5 mL lidocaine 1 %; 16 mg Hylan 16 MG/2ML Aspirate (Right): 0 mL Medications (Left): 1.5 mL lidocaine 1 %; 16 mg Hylan 16 MG/2ML Aspirate (Left): 0 mL Outcome: tolerated well, no immediate complications Procedure, treatment alternatives, risks and benefits explained, specific risks discussed. Consent was given by the patient. Immediately prior to procedure a time out was called to verify the correct patient, procedure, equipment, support staff and site/side marked as required. Patient was prepped and draped in the usual sterile fashion.      Patient tolerated the procedure well.  Aftercare was discussed.  Sherron Ales, PA-C

## 2019-11-12 ENCOUNTER — Encounter: Payer: Self-pay | Admitting: *Deleted

## 2019-11-15 ENCOUNTER — Encounter: Payer: Self-pay | Admitting: Cardiology

## 2019-11-15 ENCOUNTER — Ambulatory Visit: Payer: Medicare HMO | Admitting: Cardiology

## 2019-11-15 VITALS — BP 118/60 | HR 79 | Ht 67.0 in | Wt 237.0 lb

## 2019-11-15 DIAGNOSIS — R011 Cardiac murmur, unspecified: Secondary | ICD-10-CM

## 2019-11-15 DIAGNOSIS — R0789 Other chest pain: Secondary | ICD-10-CM

## 2019-11-15 NOTE — Progress Notes (Signed)
Clinical Summary [name] is a 64 y.o.female seen as new consult, referred by Dr Neita Carp for chest pain.   1. Chest pain -ER visit 10/2019 with chest pain - trop neg x 2.  - CXR no acute process   Jan 2016 [facility] cardiology note mentions negative stress test   - bilateral jaw pain down into midchest, pressure. Can occur at rest or with exertion. No other assocaited symptoms. Lasts about . Can radiate into back. Not positional.  - varies in frequency, can be up to once a week. - similar to 2016 symptoms  - sedentary lifestyle. Highest activity is housework, sweeping. No exertional symptoms. - some DOE walking up stairs that is chronic.   CAD risk factors: hyperlipidemia,   2. Fibromyalgia/Chronic pain - chronic neck and shoulder pains   Past Medical History:  Diagnosis Date  . DDD (degenerative disc disease), cervical   . Fibromyositis      No Known Allergies   Current Outpatient Medications  Medication Sig Dispense Refill  . aspirin EC 81 MG tablet Take 81 mg by mouth daily.     . baclofen (LIORESAL) 10 MG tablet Take 10 mg by mouth daily as needed.     Marland Kitchen BLACK CURRANT SEED OIL PO Take by mouth daily.    . Cholecalciferol (VITAMIN D3) 125 MCG (5000 UT) CAPS Take by mouth daily.     . cyclobenzaprine (FLEXERIL) 10 MG tablet Take 10 mg by mouth at bedtime as needed.     Marland Kitchen ibuprofen (ADVIL,MOTRIN) 800 MG tablet Take 800 mg by mouth daily.     Marland Kitchen OVER THE COUNTER MEDICATION as needed.      No current facility-administered medications for this visit.     Past Surgical History:  Procedure Laterality Date  . ABDOMINAL HYSTERECTOMY    . C-SPINE    . KNEE ARTHROPLASTY    . TOTAL SHOULDER ARTHROPLASTY    . TUBAL LIGATION       No Known Allergies    No family history on file.   Social History Ms. Cheese reports that she has never smoked. She has never used smokeless tobacco. Ms. Gallentine reports current alcohol use.   Review of  Systems CONSTITUTIONAL: No weight loss, fever, chills, weakness or fatigue.  HEENT: Eyes: No visual loss, blurred vision, double vision or yellow sclerae.No hearing loss, sneezing, congestion, runny nose or sore throat.  SKIN: No rash or itching.  CARDIOVASCULAR: per hpi RESPIRATORY: No shortness of breath, cough or sputum.  GASTROINTESTINAL: No anorexia, nausea, vomiting or diarrhea. No abdominal pain or blood.  GENITOURINARY: No burning on urination, no polyuria NEUROLOGICAL: No headache, dizziness, syncope, paralysis, ataxia, numbness or tingling in the extremities. No change in bowel or bladder control.  MUSCULOSKELETAL: No muscle, back pain, joint pain or stiffness.  LYMPHATICS: No enlarged nodes. No history of splenectomy.  PSYCHIATRIC: No history of depression or anxiety.  ENDOCRINOLOGIC: No reports of sweating, cold or heat intolerance. No polyuria or polydipsia.  Marland Kitchen   Physical Examination Today's Vitals   11/15/19 1457  BP: 118/60  Pulse: 79  SpO2: 97%  Weight: 237 lb (107.5 kg)  Height: 5\' 7"  (1.702 m)   Body mass index is 37.12 kg/m.  Gen: resting comfortably, no acute distress HEENT: no scleral icterus, pupils equal round and reactive, no palptable cervical adenopathy,  CV: RRR, 2/6 systoilc murmur rusb, no jvd Resp: Clear to auscultation bilaterally GI: abdomen is soft, non-tender, non-distended, normal bowel sounds, no hepatosplenomegaly MSK:  extremities are warm, no edema.  Skin: warm, no rash Neuro:  no focal deficits Psych: appropriate affect    Assessment and Plan  1. Chest pain - unclear etiology - due to heart murmur will start workup with echo, pending results likely a lexiscan   2. Heart murmur - obtain echo   F/u pending test results   Antoine Poche, M.D

## 2019-11-15 NOTE — Patient Instructions (Signed)
Your physician recommends that you schedule a follow-up appointment in: PENDING WITH DR BRANCH  Your physician recommends that you continue on your current medications as directed. Please refer to the Current Medication list given to you today.  Your physician has requested that you have an echocardiogram. Echocardiography is a painless test that uses sound waves to create images of your heart. It provides your doctor with information about the size and shape of your heart and how well your heart's chambers and valves are working. This procedure takes approximately one hour. There are no restrictions for this procedure.  Thank you for choosing [facility]!!    

## 2019-11-23 ENCOUNTER — Ambulatory Visit (INDEPENDENT_AMBULATORY_CARE_PROVIDER_SITE_OTHER): Payer: Medicare HMO

## 2019-11-23 DIAGNOSIS — R011 Cardiac murmur, unspecified: Secondary | ICD-10-CM

## 2019-11-23 LAB — ECHOCARDIOGRAM COMPLETE
Area-P 1/2: 2.79 cm2
Calc EF: 64.5 %
MV M vel: 1.55 m/s
MV Peak grad: 9.6 mmHg
S' Lateral: 2.92 cm
Single Plane A2C EF: 64.5 %
Single Plane A4C EF: 63.7 %

## 2019-12-07 ENCOUNTER — Telehealth: Payer: Self-pay | Admitting: *Deleted

## 2019-12-07 DIAGNOSIS — R079 Chest pain, unspecified: Secondary | ICD-10-CM

## 2019-12-07 NOTE — Telephone Encounter (Signed)
-----   Message from Jonathan F Branch, MD sent at 12/01/2019  1:10 PM EDT ----- Normal echo, can we get a lexiscan for chest pain   J Branch MD 

## 2019-12-07 NOTE — Telephone Encounter (Signed)
Pt notified and order placed 

## 2019-12-08 ENCOUNTER — Telehealth: Payer: Self-pay | Admitting: *Deleted

## 2019-12-08 ENCOUNTER — Encounter: Payer: Self-pay | Admitting: *Deleted

## 2019-12-08 NOTE — Telephone Encounter (Signed)
-----   Message from Antoine Poche, MD sent at 12/01/2019  1:10 PM EDT ----- Normal echo, can we get a lexiscan for chest pain   Dominga Ferry MD

## 2019-12-08 NOTE — Telephone Encounter (Signed)
Call to notify pt of date and time of lexi scan stress test. No answer, left msg to call back.

## 2019-12-16 NOTE — Progress Notes (Deleted)
Office Visit Note  Patient: [name]             Date of Birth: 1955/09/09           MRN: [id number]             PCP: Estanislado Pandy, MD Referring: Estanislado Pandy, MD Visit Date: 12/29/2019 Occupation: @GUAROCC @  Subjective:  No chief complaint on file.   History of Present Illness: [name] is a 64 y.o. female ***   Activities of Daily Living:  Patient reports morning stiffness for *** {minute/hour:19697}.   Patient {ACTIONS;DENIES/REPORTS:21021675::"Denies"} nocturnal pain.  Difficulty dressing/grooming: {ACTIONS;DENIES/REPORTS:21021675::"Denies"} Difficulty climbing stairs: {ACTIONS;DENIES/REPORTS:21021675::"Denies"} Difficulty getting out of chair: {ACTIONS;DENIES/REPORTS:21021675::"Denies"} Difficulty using hands for taps, buttons, cutlery, and/or writing: {ACTIONS;DENIES/REPORTS:21021675::"Denies"}  No Rheumatology ROS completed.   PMFS History:  Patient Active Problem List   Diagnosis Date Noted  . Dyslipidemia 07/22/2016  . Fibromyalgia 07/16/2016  . Other fatigue 07/16/2016  . Primary insomnia 07/16/2016  . DJD (degenerative joint disease), cervical 07/16/2016  . Spondylosis of lumbar region without myelopathy or radiculopathy 07/16/2016  . Primary osteoarthritis of both knees 07/16/2016  . Pain in thoracic spine 07/16/2016  . Primary osteoarthritis of both hands 07/16/2016  . Adhesive capsulitis of left shoulder 07/16/2016  . Restless leg syndrome 07/16/2016    Past Medical History:  Diagnosis Date  . DDD (degenerative disc disease), cervical   . Fibromyositis     No family history on file. Past Surgical History:  Procedure Laterality Date  . ABDOMINAL HYSTERECTOMY    . C-SPINE    . KNEE ARTHROPLASTY    . TOTAL SHOULDER ARTHROPLASTY    . TUBAL LIGATION     Social History   Social History Narrative  . Not on file    There is no immunization history on file for this patient.   Objective: Vital Signs: There were no vitals  taken for this visit.   Physical Exam   Musculoskeletal Exam: ***  CDAI Exam: CDAI Score: -- Patient Global: --; Provider Global: -- Swollen: --; Tender: -- Joint Exam 12/29/2019   No joint exam has been documented for this visit   There is currently no information documented on the homunculus. Go to the Rheumatology activity and complete the homunculus joint exam.  Investigation: No additional findings.  Imaging: ECHOCARDIOGRAM COMPLETE  Result Date: 11/23/2019    ECHOCARDIOGRAM REPORT   Patient Name:   [name] Date of Exam: 11/23/2019 Medical Rec #:  11/25/[id number]           Height:       67.0 in Accession #:    [id number]          Weight:       237.0 lb Date of Birth:  1955-08-14            BSA:          2.174 m Patient Age:    64 years            BP:           118/60 mmHg Patient Gender: F                   HR:           67 bpm. Exam Location:  [facility] Procedure: 2D Echo, Cardiac Doppler and Color Doppler Indications:     R01.1 Cardiac murmur  History:         Patient has no prior history of  Echocardiogram examinations.                  Signs/Symptoms:Murmur and Chest Pain; Risk                  Factors:Hypertension, Dyslipidemia and Non-Smoker.  Sonographer:     Jake Seats RDMS, RVT, RDCS Referring Phys:  [id number] Dorothe Pea BRANCH Diagnosing Phys: Dina Rich MD IMPRESSIONS  1. Left ventricular ejection fraction, by estimation, is 65 to 70%. The left ventricle has normal function. The left ventricle has no regional wall motion abnormalities. Left ventricular diastolic parameters are consistent with Grade I diastolic dysfunction (impaired relaxation).  2. Right ventricular systolic function is normal. The right ventricular size is normal. There is normal pulmonary artery systolic pressure.  3. The mitral valve is normal in structure. No evidence of mitral valve regurgitation. No evidence of mitral stenosis.  4. The aortic valve is tricuspid. Aortic valve regurgitation is not  visualized. No aortic stenosis is present.  5. The inferior vena cava is normal in size with greater than 50% respiratory variability, suggesting right atrial pressure of 3 mmHg. FINDINGS  Left Ventricle: Left ventricular ejection fraction, by estimation, is 65 to 70%. The left ventricle has normal function. The left ventricle has no regional wall motion abnormalities. The left ventricular internal cavity size was normal in size. There is  no left ventricular hypertrophy. Left ventricular diastolic parameters are consistent with Grade I diastolic dysfunction (impaired relaxation). Normal left ventricular filling pressure. Right Ventricle: The right ventricular size is normal. No increase in right ventricular wall thickness. Right ventricular systolic function is normal. There is normal pulmonary artery systolic pressure. The tricuspid regurgitant velocity is 1.89 m/s, and  with an assumed right atrial pressure of 10 mmHg, the estimated right ventricular systolic pressure is 24.3 mmHg. Left Atrium: Left atrial size was normal in size. Right Atrium: Right atrial size was normal in size. Pericardium: There is no evidence of pericardial effusion. Mitral Valve: The mitral valve is normal in structure. No evidence of mitral valve regurgitation. No evidence of mitral valve stenosis. Tricuspid Valve: The tricuspid valve is normal in structure. Tricuspid valve regurgitation is not demonstrated. No evidence of tricuspid stenosis. Aortic Valve: The aortic valve is tricuspid. Aortic valve regurgitation is not visualized. No aortic stenosis is present. Pulmonic Valve: The pulmonic valve was not well visualized. Pulmonic valve regurgitation is not visualized. No evidence of pulmonic stenosis. Aorta: The aortic root is normal in size and structure. Pulmonary Artery: Indeterminant PASP, inadequate TR jet. Venous: The inferior vena cava is normal in size with greater than 50% respiratory variability, suggesting right atrial pressure  of 3 mmHg. IAS/Shunts: No atrial level shunt detected by color flow Doppler.  LEFT VENTRICLE PLAX 2D LVIDd:         4.54 cm     Diastology LVIDs:         2.92 cm     LV e' lateral:   10.80 cm/s LV PW:         1.03 cm     LV E/e' lateral: 6.6 LV IVS:        0.88 cm     LV e' medial:    8.61 cm/s LVOT diam:     2.00 cm     LV E/e' medial:  8.2 LV SV:         60 LV SV Index:   27 LVOT Area:     3.14 cm  LV Volumes (MOD) LV vol  d, MOD A2C: 58.6 ml LV vol d, MOD A4C: 78.7 ml LV vol s, MOD A2C: 20.8 ml LV vol s, MOD A4C: 28.6 ml LV SV MOD A2C:     37.8 ml LV SV MOD A4C:     78.7 ml LV SV MOD BP:      44.6 ml RIGHT VENTRICLE RV S prime:     8.90 cm/s TAPSE (M-mode): 2.2 cm LEFT ATRIUM             Index LA diam:        3.00 cm 1.38 cm/m LA Vol (A2C):   66.9 ml 30.78 ml/m LA Vol (A4C):   62.4 ml 28.71 ml/m LA Biplane Vol:         29.90 ml/m  AORTIC VALVE LVOT Vmax:   92.40 cm/s LVOT Vmean:  64.700 cm/s LVOT VTI:    0.190 m  AORTA Ao Root diam: 3.10 cm MITRAL VALVE               TRICUSPID VALVE MV Area (PHT):             TR Peak grad:   14.3 mmHg MV Decel Time: 272 msec    TR Vmax:        189.00 cm/s MR Peak grad: 9.6 mmHg MR Vmax:      155.00 cm/s  SHUNTS MV E velocity: 70.80 cm/s  Systemic VTI:  0.19 m MV A velocity: 85.90 cm/s  Systemic Diam: 2.00 cm MV E/A ratio:  0.82 Dina Rich MD Electronically signed by Dina Rich MD Signature Date/Time: 11/23/2019/8:07:58 PM    Final     Recent Labs: Lab Results  Component Value Date   WBC 6.3 06/07/2009   HGB 12.2 06/07/2009   PLT 246 06/07/2009   NA 138 07/26/2016   K 4.3 07/26/2016   CL 104 07/26/2016   CO2 26 07/26/2016   GLUCOSE 98 07/26/2016   BUN 18 07/26/2016   CREATININE 0.93 07/26/2016   BILITOT 0.6 07/26/2016   ALKPHOS 68 07/26/2016   AST 18 07/26/2016   ALT 13 07/26/2016   PROT 7.0 07/26/2016   ALBUMIN 4.1 07/26/2016   CALCIUM 10.0 07/26/2016   GFRAA 77 07/26/2016    Speciality Comments: No specialty comments  available.  Procedures:  No procedures performed Allergies: Patient has no known allergies.   Assessment / Plan:     Visit Diagnoses: No diagnosis found.  Orders: No orders of the defined types were placed in this encounter.  No orders of the defined types were placed in this encounter.   Face-to-face time spent with patient was *** minutes. Greater than 50% of time was spent in counseling and coordination of care.  Follow-Up Instructions: No follow-ups on file.   Ellen Henri, CMA  Note - This record has been created using Animal nutritionist.  Chart creation errors have been sought, but may not always  have been located. Such creation errors do not reflect on  the standard of medical care.

## 2019-12-17 ENCOUNTER — Ambulatory Visit (HOSPITAL_BASED_OUTPATIENT_CLINIC_OR_DEPARTMENT_OTHER)
Admission: RE | Admit: 2019-12-17 | Discharge: 2019-12-17 | Disposition: A | Discharge status: Home / Self Care | Payer: Medicare HMO | Source: Ambulatory Visit | Attending: Cardiology | Admitting: Cardiology

## 2019-12-17 ENCOUNTER — Other Ambulatory Visit: Payer: Self-pay

## 2019-12-17 ENCOUNTER — Encounter (HOSPITAL_COMMUNITY): Payer: Self-pay

## 2019-12-17 ENCOUNTER — Ambulatory Visit (HOSPITAL_COMMUNITY)
Admission: RE | Admit: 2019-12-17 | Discharge: 2019-12-17 | Disposition: A | Discharge status: Home / Self Care | Payer: Medicare HMO | Source: Ambulatory Visit | Attending: Cardiology | Admitting: Cardiology

## 2019-12-17 DIAGNOSIS — R079 Chest pain, unspecified: Secondary | ICD-10-CM

## 2019-12-17 LAB — NM MYOCAR MULTI W/SPECT W/WALL MOTION / EF
LV dias vol: 68 mL (ref 46–106)
LV sys vol: 23 mL
Peak HR: 100 {beats}/min
RATE: 0.35
Rest HR: 68 {beats}/min
SDS: 0
SRS: 3
SSS: 3
TID: 1.06

## 2019-12-17 MED ORDER — TECHNETIUM TC 99M TETROFOSMIN IV KIT
10.0000 | PACK | Freq: Once | INTRAVENOUS | Status: AC | PRN
  Administered 2019-12-17: 10.6 via INTRAVENOUS

## 2019-12-17 MED ORDER — TECHNETIUM TC 99M TETROFOSMIN IV KIT
30.0000 | PACK | Freq: Once | INTRAVENOUS | Status: AC | PRN
  Administered 2019-12-17: 28 via INTRAVENOUS

## 2019-12-17 MED ORDER — REGADENOSON 0.4 MG/5ML IV SOLN
INTRAVENOUS | Status: AC
  Administered 2019-12-17: 0.4 mg via INTRAVENOUS
  Filled 2019-12-17: qty 5

## 2019-12-17 MED ORDER — SODIUM CHLORIDE FLUSH 0.9 % IV SOLN
INTRAVENOUS | Status: AC
  Administered 2019-12-17: 10 mL via INTRAVENOUS
  Filled 2019-12-17: qty 10

## 2019-12-23 ENCOUNTER — Telehealth: Payer: Self-pay | Admitting: *Deleted

## 2019-12-23 NOTE — Telephone Encounter (Signed)
Pt voiced understanding - 3 month f/u scheduled  

## 2019-12-23 NOTE — Telephone Encounter (Signed)
-----   Message from Antoine Poche, MD sent at 12/23/2019 11:02 AM EDT ----- Stress test looks good, no signs of significant blockages. Overlal heart workup looks good, needs to f/u with pcp to discuss other possible causes of symptoms, f/u with me or PA 3 months  Dominga Ferry MD

## 2019-12-29 ENCOUNTER — Ambulatory Visit: Payer: Medicare HMO | Admitting: Physician Assistant

## 2020-04-12 ENCOUNTER — Ambulatory Visit: Payer: Medicare HMO | Admitting: Cardiology

## 2021-04-10 NOTE — Progress Notes (Signed)
Office Visit Note  Patient: [name]             Date of Birth: [birth year]           MRN: [id number]             PCP: Manon Hilding, MD Referring: Manon Hilding, MD Visit Date: 04/11/2021 Occupation: @GUAROCC @  Subjective:  Trapezius muscle tension and tenderness bilaterally.  History of Present Illness: [name] is a 66 y.o. female with history of fibromyalgia, osteoarthritis, and DDD.  She continues to experience intermittent myalgias and muscle tenderness due to fibromyalgia.  She presents today with trapezius muscle tension and tenderness bilaterally.  She experiences muscle spasms intermittently.  She requested bilateral trigger point injections today.  She continues to have persistent discomfort in both knee joints.  She has been taking diclofenac 50 mg 1 tablet by mouth twice daily for pain relief.  She would like to reapply for Visco gel injections for both knees.  Activities of Daily Living:  Patient reports morning stiffness for 30 minutes.   Patient Reports nocturnal pain.  Difficulty dressing/grooming: Denies Difficulty climbing stairs: Reports Difficulty getting out of chair: Reports Difficulty using hands for taps, buttons, cutlery, and/or writing: Denies  Review of Systems  Constitutional:  Negative for fatigue.  HENT:  Negative for mouth dryness.   Eyes:  Negative for dryness.  Respiratory:  Negative for shortness of breath.   Cardiovascular:  Negative for swelling in legs/feet.  Gastrointestinal:  Negative for constipation.  Endocrine: Positive for increased urination.  Genitourinary:  Negative for difficulty urinating.  Musculoskeletal:  Positive for joint pain, joint pain, morning stiffness and muscle tenderness.  Skin:  Negative for rash.  Allergic/Immunologic: Negative for susceptible to infections.  Neurological:  Positive for numbness.  Hematological:  Positive for bruising/bleeding tendency.  Psychiatric/Behavioral:  Negative for  sleep disturbance.    PMFS History:  Patient Active Problem List   Diagnosis Date Noted   Dyslipidemia 07/22/2016   Fibromyalgia 07/16/2016   Other fatigue 07/16/2016   Primary insomnia 07/16/2016   DJD (degenerative joint disease), cervical 07/16/2016   Spondylosis of lumbar region without myelopathy or radiculopathy 07/16/2016   Primary osteoarthritis of both knees 07/16/2016   Pain in thoracic spine 07/16/2016   Primary osteoarthritis of both hands 07/16/2016   Adhesive capsulitis of left shoulder 07/16/2016   Restless leg syndrome 07/16/2016    Past Medical History:  Diagnosis Date   DDD (degenerative disc disease), cervical    Fibromyositis     History reviewed. No pertinent family history. Past Surgical History:  Procedure Laterality Date   ABDOMINAL HYSTERECTOMY     C-SPINE     KNEE ARTHROPLASTY     TOTAL SHOULDER ARTHROPLASTY     TUBAL LIGATION     Social History   Social History Narrative   Not on file    There is no immunization history on file for this patient.   Objective: Vital Signs: BP 131/81 (BP Location: Left Arm, Patient Position: Sitting, Cuff Size: Normal)    Pulse 69    Resp 16    Ht 5\' 7"  (1.702 m)    Wt 235 lb (106.6 kg)    BMI 36.81 kg/m    Physical Exam Vitals and nursing note reviewed.  Constitutional:      Appearance: She is well-developed.  HENT:     Head: Normocephalic and atraumatic.  Eyes:     Conjunctiva/sclera: Conjunctivae normal.  Pulmonary:  Effort: Pulmonary effort is normal.  Abdominal:     Palpations: Abdomen is soft.  Musculoskeletal:     Cervical back: Normal range of motion.  Skin:    General: Skin is warm and dry.     Capillary Refill: Capillary refill takes less than 2 seconds.  Neurological:     Mental Status: She is alert and oriented to person, place, and time.  Psychiatric:        Behavior: Behavior normal.     Musculoskeletal Exam: Slightly limited range of motion with lateral rotation of the  C-spine.  No midline spinal tenderness noted.  Trapezius muscle tension and tenderness noted bilaterally.  Shoulder joints, elbow joints, wrist joints, MCPs, PIPs, DIPs have good range of motion with no synovitis.  Complete fist formation bilaterally.  PIP and DIP thickening consistent with osteoarthritis of both hands.  Hip joints have good range of motion with no groin pain.  Knee joints have painful range of motion with no warmth or effusion.  Ankle joints have good range of motion with no tenderness or joint swelling.  No tenderness over MTP joints.  Some PIP and DIP thickening consistent with osteoarthritis.  CDAI Exam: CDAI Score: -- Patient Global: --; Provider Global: -- Swollen: --; Tender: -- Joint Exam 04/11/2021   No joint exam has been documented for this visit   There is currently no information documented on the homunculus. Go to the Rheumatology activity and complete the homunculus joint exam.  Investigation: No additional findings.  Imaging: No results found.  Recent Labs: Lab Results  Component Value Date   WBC 6.3 06/07/2009   HGB 12.2 06/07/2009   PLT 246 06/07/2009   NA 138 07/26/2016   K 4.3 07/26/2016   CL 104 07/26/2016   CO2 26 07/26/2016   GLUCOSE 98 07/26/2016   BUN 18 07/26/2016   CREATININE 0.93 07/26/2016   BILITOT 0.6 07/26/2016   ALKPHOS 68 07/26/2016   AST 18 07/26/2016   ALT 13 07/26/2016   PROT 7.0 07/26/2016   ALBUMIN 4.1 07/26/2016   CALCIUM 10.0 07/26/2016   GFRAA 77 07/26/2016    Speciality Comments: No specialty comments available.  Procedures:  Trigger Point Inj  Date/Time: 04/11/2021 10:33 AM Performed by: Gearldine Bienenstock, PA-C Authorized by: Gearldine Bienenstock, PA-C   Consent Given by:  Patient Site marked: the procedure site was marked   Timeout: prior to procedure the correct patient, procedure, and site was verified   Indications:  Pain Total # of Trigger Points:  2 Location: neck   Needle Size:  27 G Approach:   Dorsal Medications #1:  0.5 mL lidocaine 1 %; 10 mg triamcinolone acetonide 40 MG/ML Medications #2:  0.5 mL lidocaine 1 %; 10 mg triamcinolone acetonide 40 MG/ML Patient tolerance:  Patient tolerated the procedure well with no immediate complications Allergies: Patient has no known allergies.   Assessment / Plan:     Visit Diagnoses: Fibromyalgia: She continues to experience intermittent myalgias and muscle tenderness due to fibromyalgia.  She presents today with increased trapezius muscle tension and tenderness bilaterally.  She has been experiencing muscle spasms intermittently.  Her symptoms have been more severe for the past 1 month which she attributes to increased physical activity as well as stress over the holidays.  She requested trigger point injections today.  She tolerated the procedure well.  Procedure note was completed above.  Discussed that she may benefit from maintenance massages as well as dry needling in the future.  In the meantime she can continue to use lidocaine patches as well as heat as needed for symptomatic relief. Discussed the importance of regular exercise and good sleep hygiene.  She will follow-up in the office in 6 months.  Primary insomnia -Discussed the importance of sleep hygiene.  Other fatigue: Discussed the importance of regular exercise and good sleep hygiene.  Primary osteoarthritis of both knees - She has chronic pain in both knee joints.  She has painful range of motion of both knees on examination today.  No warmth or effusion was noted.  She has not been experiencing any mechanical symptoms.  She has difficulty walking prolonged distances as well as rising from a seated position.  She underwent Visco gel injections in both knees in May 2021 which provided significant pain relief.  She requested for [place] to reapply for Visco gel injections for both knees.  X-rays of both knees were updated today.  She will continue on diclofenac 50 mg 1 tablet twice daily as  needed for pain relief.- Plan: XR KNEE 3 VIEW LEFT, XR KNEE 3 VIEW RIGHT  This patient is diagnosed with osteoarthritis of the knee(s).    Radiographs show evidence of joint space narrowing, osteophytes, subchondral sclerosis and/or subchondral cysts.  This patient has knee pain which interferes with functional and activities of daily living.    This patient has experienced inadequate response, adverse effects and/or intolerance with conservative treatments such as acetaminophen, NSAIDS, topical creams, physical therapy or regular exercise, knee bracing and/or weight loss.   This patient has experienced inadequate response or has a contraindication to intra articular steroid injections for at least 3 months.   This patient is not scheduled to have a total knee replacement within 6 months of starting treatment with viscosupplementation.  Chronic pain of both knees - She continues to have persistent discomfort in both knee joints.  X-rays of both knees were updated today.  She would like to reapply for Visco gel injections for both knees which have provided significant pain relief in the past.  Plan: XR KNEE 3 VIEW LEFT, XR KNEE 3 VIEW RIGHT  Primary osteoarthritis of both hands: She has PIP and DIP thickening consistent with osteoarthritis of both hands.  No tenderness or inflammation was noted.  She is able to make a complete fist bilaterally.  Adhesive capsulitis of left shoulder - History of manipulation: She has good range of motion of the left shoulder joint on examination today.  Trapezius muscle spasm: She presents today with trapezius muscle tension and tenderness bilaterally.  Her symptoms have been more severe for the past 1 month.  She has been experiencing muscle spasms intermittently.  She has slightly limited range of motion of the C-spine with lateral rotation.  She had trigger point injections performed on 12/09/2018 which provided significant relief.  She requested trigger point  injections today.  She tolerated the procedure well.  Procedure note was completed above.  Aftercare was discussed. She will benefit from massages on a maintenance basis as well as possible dry needling and physical therapy in the future if her symptoms are recurrent.  DDD (degenerative disc disease), cervical - Status post discectomy: She has limited range of motion with lateral rotation.  No symptoms of radiculopathy at this time.  DDD (degenerative disc disease), thoracic: She experiences intermittent thoracic pain.  Most of her symptoms are due to trapezius muscle tension and tenderness bilaterally.  Trigger point injections were performed today.  DDD lumbar: She continues to experience intermittent  discomfort in her lower back.  She had no midline spinal tenderness on examination today.  No symptoms of radiculopathy.  Other medical conditions are listed as follows:  Anxiety - She tried taking Cymbalta and Effexor in the past but could not tolerate it due to side effects.   Dyslipidemia  Restless leg syndrome  History of depression   Orders: Orders Placed This Encounter  Procedures   Trigger Point Inj   XR KNEE 3 VIEW LEFT   XR KNEE 3 VIEW RIGHT   No orders of the defined types were placed in this encounter.     Follow-Up Instructions: Return in about 6 months (around 10/09/2021) for Fibromyalgia, Osteoarthritis, DDD.   Ofilia Neas, PA-C  Note - This record has been created using Dragon software.  Chart creation errors have been sought, but may not always  have been located. Such creation errors do not reflect on  the standard of medical care.

## 2021-04-11 ENCOUNTER — Ambulatory Visit: Payer: Self-pay

## 2021-04-11 ENCOUNTER — Encounter: Payer: Self-pay | Admitting: Physician Assistant

## 2021-04-11 ENCOUNTER — Telehealth: Payer: Self-pay

## 2021-04-11 ENCOUNTER — Other Ambulatory Visit: Payer: Self-pay

## 2021-04-11 ENCOUNTER — Ambulatory Visit: Payer: Medicare Other | Admitting: Physician Assistant

## 2021-04-11 VITALS — BP 131/81 | HR 69 | Resp 16 | Ht 67.0 in | Wt 235.0 lb

## 2021-04-11 DIAGNOSIS — M79645 Pain in left finger(s): Secondary | ICD-10-CM

## 2021-04-11 DIAGNOSIS — M19041 Primary osteoarthritis, right hand: Secondary | ICD-10-CM

## 2021-04-11 DIAGNOSIS — M170 Bilateral primary osteoarthritis of knee: Secondary | ICD-10-CM | POA: Diagnosis not present

## 2021-04-11 DIAGNOSIS — Z8659 Personal history of other mental and behavioral disorders: Secondary | ICD-10-CM

## 2021-04-11 DIAGNOSIS — M5030 Other cervical disc degeneration, unspecified cervical region: Secondary | ICD-10-CM

## 2021-04-11 DIAGNOSIS — M25561 Pain in right knee: Secondary | ICD-10-CM | POA: Diagnosis not present

## 2021-04-11 DIAGNOSIS — F5101 Primary insomnia: Secondary | ICD-10-CM | POA: Diagnosis not present

## 2021-04-11 DIAGNOSIS — G2581 Restless legs syndrome: Secondary | ICD-10-CM

## 2021-04-11 DIAGNOSIS — M25562 Pain in left knee: Secondary | ICD-10-CM

## 2021-04-11 DIAGNOSIS — M797 Fibromyalgia: Secondary | ICD-10-CM

## 2021-04-11 DIAGNOSIS — M62838 Other muscle spasm: Secondary | ICD-10-CM

## 2021-04-11 DIAGNOSIS — G8929 Other chronic pain: Secondary | ICD-10-CM

## 2021-04-11 DIAGNOSIS — M7502 Adhesive capsulitis of left shoulder: Secondary | ICD-10-CM

## 2021-04-11 DIAGNOSIS — M19042 Primary osteoarthritis, left hand: Secondary | ICD-10-CM

## 2021-04-11 DIAGNOSIS — R5383 Other fatigue: Secondary | ICD-10-CM | POA: Diagnosis not present

## 2021-04-11 DIAGNOSIS — F419 Anxiety disorder, unspecified: Secondary | ICD-10-CM

## 2021-04-11 DIAGNOSIS — M5134 Other intervertebral disc degeneration, thoracic region: Secondary | ICD-10-CM

## 2021-04-11 DIAGNOSIS — M47816 Spondylosis without myelopathy or radiculopathy, lumbar region: Secondary | ICD-10-CM

## 2021-04-11 DIAGNOSIS — E785 Hyperlipidemia, unspecified: Secondary | ICD-10-CM

## 2021-04-11 MED ORDER — LIDOCAINE HCL 1 % IJ SOLN
0.5000 mL | INTRAMUSCULAR | Status: AC | PRN
  Administered 2021-04-11: .5 mL

## 2021-04-11 MED ORDER — TRIAMCINOLONE ACETONIDE 40 MG/ML IJ SUSP
10.0000 mg | INTRAMUSCULAR | Status: AC | PRN
  Administered 2021-04-11: 10 mg via INTRAMUSCULAR

## 2021-04-11 NOTE — Telephone Encounter (Signed)
Please apply for bilateral knee visco, per Taylor Dale, PA-C. Thanks!  

## 2021-04-11 NOTE — Progress Notes (Signed)
X-rays of both knees consistent with severe osteoarthritis and severe chondromalacia patella.   Please notify the patient and apply for visco gel injections for both knees.

## 2021-04-16 NOTE — Telephone Encounter (Signed)
VOB submitted for Synvisc, Bilateral knees BV Pending

## 2021-04-27 NOTE — Telephone Encounter (Signed)
Please call to schedule Visco knee injections.  Authorized for Synvisc series Bilateral Knees. Buy and Bill No [place] Insurance will cover 100% of injection after a $35.00 co pay each visit and 80% of medication charge

## 2021-05-24 NOTE — Telephone Encounter (Signed)
LMOM for patient to call and schedule Synvics injections for bilateral knees.

## 2021-06-06 NOTE — Telephone Encounter (Signed)
LMOM for patient to call and schedule Synvisc injections °

## 2021-06-25 ENCOUNTER — Other Ambulatory Visit: Payer: Self-pay

## 2021-06-25 ENCOUNTER — Ambulatory Visit: Payer: Medicare Other | Admitting: Physician Assistant

## 2021-06-25 DIAGNOSIS — M170 Bilateral primary osteoarthritis of knee: Secondary | ICD-10-CM

## 2021-06-25 MED ORDER — HYLAN G-F 20 16 MG/2ML IX SOSY
16.0000 mg | PREFILLED_SYRINGE | INTRA_ARTICULAR | Status: AC | PRN
  Administered 2021-06-25: 16 mg via INTRA_ARTICULAR

## 2021-06-25 MED ORDER — LIDOCAINE HCL 1 % IJ SOLN
1.5000 mL | INTRAMUSCULAR | Status: AC | PRN
  Administered 2021-06-25: 1.5 mL via INTRA_ARTICULAR

## 2021-06-25 NOTE — Progress Notes (Signed)
? ?  Procedure Note ? ?Patient: [name]             ?Date of Birth: [birth year]           ?MRN: [id number]             ?Visit Date: 06/25/2021 ? ?Procedures: ?Visit Diagnoses:  ?1. Primary osteoarthritis of both knees   ? ? ?Synvisc #1 bilateral knees, B/B ?Large Joint Inj: bilateral knee on 06/25/2021 9:29 AM ?Indications: pain ?Details: 25 G 1.5 in needle, medial approach ? ?Arthrogram: No ? ?Medications (Right): 1.5 mL lidocaine 1 %; 16 mg Hylan 16 MG/2ML ?Aspirate (Right): 0 mL ?Medications (Left): 1.5 mL lidocaine 1 %; 16 mg Hylan 16 MG/2ML ?Aspirate (Left): 0 mL ?Outcome: tolerated well, no immediate complications ?Procedure, treatment alternatives, risks and benefits explained, specific risks discussed. Consent was given by the patient. Immediately prior to procedure a time out was called to verify the correct patient, procedure, equipment, support staff and site/side marked as required. Patient was prepped and draped in the usual sterile fashion.  ? ? ?Patient tolerated the procedure well.  Aftercare was discussed.  ?Sherron Ales, PA-C ? ?

## 2021-07-02 ENCOUNTER — Other Ambulatory Visit: Payer: Self-pay

## 2021-07-02 ENCOUNTER — Ambulatory Visit: Payer: Medicare Other | Admitting: Physician Assistant

## 2021-07-02 DIAGNOSIS — M170 Bilateral primary osteoarthritis of knee: Secondary | ICD-10-CM

## 2021-07-02 MED ORDER — LIDOCAINE HCL 1 % IJ SOLN
1.5000 mL | INTRAMUSCULAR | Status: AC | PRN
  Administered 2021-07-02: 1.5 mL via INTRA_ARTICULAR

## 2021-07-02 MED ORDER — HYLAN G-F 20 16 MG/2ML IX SOSY
16.0000 mg | PREFILLED_SYRINGE | INTRA_ARTICULAR | Status: AC | PRN
  Administered 2021-07-02: 16 mg via INTRA_ARTICULAR

## 2021-07-02 NOTE — Progress Notes (Signed)
? ?  Procedure Note ? ?Patient: [name]             ?Date of Birth: November 12, 1955           ?MRN: [id number]             ?Visit Date: 07/02/2021 ? ?Procedures: ?Visit Diagnoses:  ?1. Primary osteoarthritis of both knees   ? ?Synvisc #2 bilateral knees, B/B ?Large Joint Inj: bilateral knee on 07/02/2021 10:04 AM ?Indications: pain ?Details: 25 G 1.5 in needle, medial approach ? ?Arthrogram: No ? ?Medications (Right): 1.5 mL lidocaine 1 %; 16 mg Hylan 16 MG/2ML ?Aspirate (Right): 0 mL ?Medications (Left): 1.5 mL lidocaine 1 %; 16 mg Hylan 16 MG/2ML ?Aspirate (Left): 0 mL ?Outcome: tolerated well, no immediate complications ?Procedure, treatment alternatives, risks and benefits explained, specific risks discussed. Consent was given by the patient. Immediately prior to procedure a time out was called to verify the correct patient, procedure, equipment, support staff and site/side marked as required. Patient was prepped and draped in the usual sterile fashion.  ? ?Patient tolerated the procedure well.  Aftercare was discussed.  ?I reviewed x-rays from 04/11/2021 with the patient today in the office.  I printed off copies for the patient to review. ?Sherron Ales, PA-C  ? ? ?

## 2021-07-09 ENCOUNTER — Ambulatory Visit: Payer: Medicare Other | Admitting: Physician Assistant

## 2021-07-12 ENCOUNTER — Ambulatory Visit: Payer: Medicare Other | Admitting: Physician Assistant

## 2021-07-12 DIAGNOSIS — M170 Bilateral primary osteoarthritis of knee: Secondary | ICD-10-CM | POA: Diagnosis not present

## 2021-07-12 MED ORDER — LIDOCAINE HCL 1 % IJ SOLN
1.5000 mL | INTRAMUSCULAR | Status: AC | PRN
  Administered 2021-07-12: 1.5 mL via INTRA_ARTICULAR

## 2021-07-12 MED ORDER — HYLAN G-F 20 16 MG/2ML IX SOSY
16.0000 mg | PREFILLED_SYRINGE | INTRA_ARTICULAR | Status: AC | PRN
  Administered 2021-07-12: 16 mg via INTRA_ARTICULAR

## 2021-07-12 NOTE — Progress Notes (Signed)
? ?  Procedure Note ? ?Patient: [name]             ?Date of Birth: Jul 02, 1955           ?MRN: [id number]             ?Visit Date: 07/12/2021 ? ?Procedures: ?Visit Diagnoses:  ?1. Primary osteoarthritis of both knees   ? ?Synvisc #3 bilateral knees, B/B ?Large Joint Inj: bilateral knee on 07/12/2021 9:20 AM ?Indications: pain ?Details: 25 G 1.5 in needle, medial approach ? ?Arthrogram: No ? ?Medications (Right): 1.5 mL lidocaine 1 %; 16 mg Hylan 16 MG/2ML ?Aspirate (Right): 0 mL ?Medications (Left): 1.5 mL lidocaine 1 %; 16 mg Hylan 16 MG/2ML ?Aspirate (Left): 0 mL ?Outcome: tolerated well, no immediate complications ?Procedure, treatment alternatives, risks and benefits explained, specific risks discussed. Consent was given by the patient. Immediately prior to procedure a time out was called to verify the correct patient, procedure, equipment, support staff and site/side marked as required. Patient was prepped and draped in the usual sterile fashion.  ? ? ?Patient tolerated the procedure well.  Aftercare was discussed.  ?Sherron Ales, PA-C  ? ?

## 2021-09-27 NOTE — Progress Notes (Deleted)
Office Visit Note  Patient: [name]             Date of Birth: 08-06-1955           MRN: [id number]             PCP: Estanislado Pandy, MD Referring: Estanislado Pandy, MD Visit Date: 10/11/2021 Occupation: @GUAROCC @  Subjective:  No chief complaint on file.   History of Present Illness: [name] is a 66 y.o. female ***   Activities of Daily Living:  Patient reports morning stiffness for *** {minute/hour:19697}.   Patient {ACTIONS;DENIES/REPORTS:21021675::"Denies"} nocturnal pain.  Difficulty dressing/grooming: {ACTIONS;DENIES/REPORTS:21021675::"Denies"} Difficulty climbing stairs: {ACTIONS;DENIES/REPORTS:21021675::"Denies"} Difficulty getting out of chair: {ACTIONS;DENIES/REPORTS:21021675::"Denies"} Difficulty using hands for taps, buttons, cutlery, and/or writing: {ACTIONS;DENIES/REPORTS:21021675::"Denies"}  No Rheumatology ROS completed.   PMFS History:  Patient Active Problem List   Diagnosis Date Noted  . Dyslipidemia 07/22/2016  . Fibromyalgia 07/16/2016  . Other fatigue 07/16/2016  . Primary insomnia 07/16/2016  . DJD (degenerative joint disease), cervical 07/16/2016  . Spondylosis of lumbar region without myelopathy or radiculopathy 07/16/2016  . Primary osteoarthritis of both knees 07/16/2016  . Pain in thoracic spine 07/16/2016  . Primary osteoarthritis of both hands 07/16/2016  . Adhesive capsulitis of left shoulder 07/16/2016  . Restless leg syndrome 07/16/2016    Past Medical History:  Diagnosis Date  . DDD (degenerative disc disease), cervical   . Fibromyositis     No family history on file. Past Surgical History:  Procedure Laterality Date  . ABDOMINAL HYSTERECTOMY    . C-SPINE    . KNEE ARTHROPLASTY    . TOTAL SHOULDER ARTHROPLASTY    . TUBAL LIGATION     Social History   Social History Narrative  . Not on file   Immunization History  Administered Date(s) Administered  . MODERNA COVID-19 SARS-COV-2 PEDS BIVALENT BOOSTER  6Y-11Y 02/21/2020  . Moderna Covid-19 Vaccine Bivalent Booster 30yrs & up 12/29/2020  . Moderna Sars-Covid-2 Vaccination 05/29/2019, 06/26/2019     Objective: Vital Signs: There were no vitals taken for this visit.   Physical Exam   Musculoskeletal Exam: ***  CDAI Exam: CDAI Score: -- Patient Global: --; Provider Global: -- Swollen: --; Tender: -- Joint Exam 10/11/2021   No joint exam has been documented for this visit   There is currently no information documented on the homunculus. Go to the Rheumatology activity and complete the homunculus joint exam.  Investigation: No additional findings.  Imaging: No results found.  Recent Labs: Lab Results  Component Value Date   WBC 6.3 06/07/2009   HGB 12.2 06/07/2009   PLT 246 06/07/2009   NA 138 07/26/2016   K 4.3 07/26/2016   CL 104 07/26/2016   CO2 26 07/26/2016   GLUCOSE 98 07/26/2016   BUN 18 07/26/2016   CREATININE 0.93 07/26/2016   BILITOT 0.6 07/26/2016   ALKPHOS 68 07/26/2016   AST 18 07/26/2016   ALT 13 07/26/2016   PROT 7.0 07/26/2016   ALBUMIN 4.1 07/26/2016   CALCIUM 10.0 07/26/2016   GFRAA 77 07/26/2016    Speciality Comments: No specialty comments available.  Procedures:  No procedures performed Allergies: Patient has no known allergies.   Assessment / Plan:     Visit Diagnoses: Fibromyalgia  Primary insomnia  Other fatigue  Primary osteoarthritis of both hands  Primary osteoarthritis of both knees  Adhesive capsulitis of left shoulder  Trapezius muscle spasm  DDD (degenerative disc disease), cervical  DDD (degenerative disc disease), thoracic  DDD lumbar   Anxiety  Dyslipidemia  Restless leg syndrome  History of depression  Orders: No orders of the defined types were placed in this encounter.  No orders of the defined types were placed in this encounter.   Face-to-face time spent with patient was *** minutes. Greater than 50% of time was spent in counseling and  coordination of care.  Follow-Up Instructions: No follow-ups on file.   Gearldine Bienenstock, PA-C  Note - This record has been created using Dragon software.  Chart creation errors have been sought, but may not always  have been located. Such creation errors do not reflect on  the standard of medical care.

## 2021-10-11 ENCOUNTER — Ambulatory Visit: Payer: Medicare Other | Admitting: Rheumatology

## 2021-10-11 DIAGNOSIS — M797 Fibromyalgia: Secondary | ICD-10-CM

## 2021-10-11 DIAGNOSIS — E785 Hyperlipidemia, unspecified: Secondary | ICD-10-CM

## 2021-10-11 DIAGNOSIS — R5383 Other fatigue: Secondary | ICD-10-CM

## 2021-10-11 DIAGNOSIS — M170 Bilateral primary osteoarthritis of knee: Secondary | ICD-10-CM

## 2021-10-11 DIAGNOSIS — M19041 Primary osteoarthritis, right hand: Secondary | ICD-10-CM

## 2021-10-11 DIAGNOSIS — G2581 Restless legs syndrome: Secondary | ICD-10-CM

## 2021-10-11 DIAGNOSIS — M47816 Spondylosis without myelopathy or radiculopathy, lumbar region: Secondary | ICD-10-CM

## 2021-10-11 DIAGNOSIS — F419 Anxiety disorder, unspecified: Secondary | ICD-10-CM

## 2021-10-11 DIAGNOSIS — Z8659 Personal history of other mental and behavioral disorders: Secondary | ICD-10-CM

## 2021-10-11 DIAGNOSIS — F5101 Primary insomnia: Secondary | ICD-10-CM

## 2021-10-11 DIAGNOSIS — M62838 Other muscle spasm: Secondary | ICD-10-CM

## 2021-10-11 DIAGNOSIS — M5030 Other cervical disc degeneration, unspecified cervical region: Secondary | ICD-10-CM

## 2021-10-11 DIAGNOSIS — M7502 Adhesive capsulitis of left shoulder: Secondary | ICD-10-CM

## 2021-10-11 DIAGNOSIS — M5134 Other intervertebral disc degeneration, thoracic region: Secondary | ICD-10-CM

## 2023-12-24 NOTE — Progress Notes (Deleted)
 Office Visit Note  Patient: [name]             Date of Birth: Apr 14, 1955           MRN: [id number]             PCP: Atilano Deward ORN, MD Referring: Atilano Deward ORN, MD Visit Date: 01/07/2024 Occupation: Data Unavailable  Subjective:  No chief complaint on file.   History of Present Illness: [name] is a 68 y.o. female ***    [facility] march/April 2023    Activities of Daily Living:  Patient reports morning stiffness for *** {minute/hour:19697}.   Patient {ACTIONS;DENIES/REPORTS:21021675::Denies} nocturnal pain.  Difficulty dressing/grooming: {ACTIONS;DENIES/REPORTS:21021675::Denies} Difficulty climbing stairs: {ACTIONS;DENIES/REPORTS:21021675::Denies} Difficulty getting out of chair: {ACTIONS;DENIES/REPORTS:21021675::Denies} Difficulty using hands for taps, buttons, cutlery, and/or writing: {ACTIONS;DENIES/REPORTS:21021675::Denies}  No Rheumatology ROS completed.   PMFS History:  Patient Active Problem List   Diagnosis Date Noted   Dyslipidemia 07/22/2016   Fibromyalgia 07/16/2016   Other fatigue 07/16/2016   Primary insomnia 07/16/2016   DJD (degenerative joint disease), cervical 07/16/2016   Spondylosis of lumbar region without myelopathy or radiculopathy 07/16/2016   Primary osteoarthritis of both knees 07/16/2016   Pain in thoracic spine 07/16/2016   Primary osteoarthritis of both hands 07/16/2016   Adhesive capsulitis of left shoulder 07/16/2016   Restless leg syndrome 07/16/2016    Past Medical History:  Diagnosis Date   DDD (degenerative disc disease), cervical    Fibromyositis     No family history on file. Past Surgical History:  Procedure Laterality Date   ABDOMINAL HYSTERECTOMY     C-SPINE     KNEE ARTHROPLASTY     TOTAL SHOULDER ARTHROPLASTY     TUBAL LIGATION     Social History   Tobacco Use   Smoking status: Never   Smokeless tobacco: Never  Vaping Use   Vaping status: Never Used  Substance Use Topics    Alcohol use: Yes    Comment: OCC   Drug use: Never   Social History   Social History Narrative   Not on file     Immunization History  Administered Date(s) Administered   MODERNA COVID-19 SARS-COV-2 PEDS BIVALENT BOOSTER 72yr-43yr 02/21/2020   Moderna Covid-19 Vaccine Bivalent Booster 77yrs & up 12/29/2020   Moderna Sars-Covid-2 Vaccination 05/29/2019, 06/26/2019     Objective: Vital Signs: There were no vitals taken for this visit.   Physical Exam   Musculoskeletal Exam: ***  CDAI Exam: CDAI Score: -- Patient Global: --; Provider Global: -- Swollen: --; Tender: -- Joint Exam 01/07/2024   No joint exam has been documented for this visit   There is currently no information documented on the homunculus. Go to the Rheumatology activity and complete the homunculus joint exam.  Investigation: No additional findings.  Imaging: No results found.  Recent Labs: Lab Results  Component Value Date   WBC 6.3 06/07/2009   HGB 12.2 06/07/2009   PLT 246 06/07/2009   NA 138 07/26/2016   K 4.3 07/26/2016   CL 104 07/26/2016   CO2 26 07/26/2016   GLUCOSE 98 07/26/2016   BUN 18 07/26/2016   CREATININE 0.93 07/26/2016   BILITOT 0.6 07/26/2016   ALKPHOS 68 07/26/2016   AST 18 07/26/2016   ALT 13 07/26/2016   PROT 7.0 07/26/2016   ALBUMIN 4.1 07/26/2016   CALCIUM 10.0 07/26/2016   GFRAA 77 07/26/2016    Speciality Comments: No specialty comments available.  Procedures:  No procedures performed Allergies: Patient has  no known allergies.   Assessment / Plan:     Visit Diagnoses: Fibromyalgia  Primary insomnia  Other fatigue  Primary osteoarthritis of both hands  Primary osteoarthritis of both knees  Adhesive capsulitis of left shoulder  Trapezius muscle spasm  DDD (degenerative disc disease), cervical  DDD (degenerative disc disease), thoracic  Lumbar spondylosis  Anxiety and depression  Dyslipidemia  RLS (restless legs syndrome)  Orders: No  orders of the defined types were placed in this encounter.  No orders of the defined types were placed in this encounter.   Face-to-face time spent with patient was *** minutes. Greater than 50% of time was spent in counseling and coordination of care.  Follow-Up Instructions: No follow-ups on file.   Waddell CHRISTELLA Craze, PA-C  Note - This record has been created using Dragon software.  Chart creation errors have been sought, but may not always  have been located. Such creation errors do not reflect on  the standard of medical care.

## 2024-01-07 ENCOUNTER — Ambulatory Visit: Admitting: Physician Assistant

## 2024-01-07 DIAGNOSIS — M797 Fibromyalgia: Secondary | ICD-10-CM

## 2024-01-07 DIAGNOSIS — F419 Anxiety disorder, unspecified: Secondary | ICD-10-CM

## 2024-01-07 DIAGNOSIS — M5030 Other cervical disc degeneration, unspecified cervical region: Secondary | ICD-10-CM

## 2024-01-07 DIAGNOSIS — M19041 Primary osteoarthritis, right hand: Secondary | ICD-10-CM

## 2024-01-07 DIAGNOSIS — M5134 Other intervertebral disc degeneration, thoracic region: Secondary | ICD-10-CM

## 2024-01-07 DIAGNOSIS — M62838 Other muscle spasm: Secondary | ICD-10-CM

## 2024-01-07 DIAGNOSIS — M47816 Spondylosis without myelopathy or radiculopathy, lumbar region: Secondary | ICD-10-CM

## 2024-01-07 DIAGNOSIS — G2581 Restless legs syndrome: Secondary | ICD-10-CM

## 2024-01-07 DIAGNOSIS — R5383 Other fatigue: Secondary | ICD-10-CM

## 2024-01-07 DIAGNOSIS — F5101 Primary insomnia: Secondary | ICD-10-CM

## 2024-01-07 DIAGNOSIS — M170 Bilateral primary osteoarthritis of knee: Secondary | ICD-10-CM

## 2024-01-07 DIAGNOSIS — E785 Hyperlipidemia, unspecified: Secondary | ICD-10-CM

## 2024-01-07 DIAGNOSIS — M7502 Adhesive capsulitis of left shoulder: Secondary | ICD-10-CM

## 2024-01-27 NOTE — Progress Notes (Deleted)
 Office Visit Note  Patient: [name]             Date of Birth: [birth year]           MRN: [id number]             PCP: Atilano Deward ORN, MD Referring: Atilano Deward ORN, MD Visit Date: 02/10/2024 Occupation: Data Unavailable  Subjective:    History of Present Illness: [name] is a 68 y.o. female with history of fibromyalgia and osteoarthritis.   Patient was last seen in Spring 2023 for viscosupplementation for both knees.    Visco march/April 2023    Activities of Daily Living:  Patient reports morning stiffness for *** {minute/hour:19697}.   Patient {ACTIONS;DENIES/REPORTS:21021675::Denies} nocturnal pain.  Difficulty dressing/grooming: {ACTIONS;DENIES/REPORTS:21021675::Denies} Difficulty climbing stairs: {ACTIONS;DENIES/REPORTS:21021675::Denies} Difficulty getting out of chair: {ACTIONS;DENIES/REPORTS:21021675::Denies} Difficulty using hands for taps, buttons, cutlery, and/or writing: {ACTIONS;DENIES/REPORTS:21021675::Denies}  No Rheumatology ROS completed.   PMFS History:  Patient Active Problem List   Diagnosis Date Noted   Dyslipidemia 07/22/2016   Fibromyalgia 07/16/2016   Other fatigue 07/16/2016   Primary insomnia 07/16/2016   DJD (degenerative joint disease), cervical 07/16/2016   Spondylosis of lumbar region without myelopathy or radiculopathy 07/16/2016   Primary osteoarthritis of both knees 07/16/2016   Pain in thoracic spine 07/16/2016   Primary osteoarthritis of both hands 07/16/2016   Adhesive capsulitis of left shoulder 07/16/2016   Restless leg syndrome 07/16/2016    Past Medical History:  Diagnosis Date   DDD (degenerative disc disease), cervical    Fibromyositis     No family history on file. Past Surgical History:  Procedure Laterality Date   ABDOMINAL HYSTERECTOMY     C-SPINE     KNEE ARTHROPLASTY     TOTAL SHOULDER ARTHROPLASTY     TUBAL LIGATION     Social History   Tobacco Use   Smoking status: Never    Smokeless tobacco: Never  Vaping Use   Vaping status: Never Used  Substance Use Topics   Alcohol use: Yes    Comment: OCC   Drug use: Never   Social History   Social History Narrative   Not on file     Immunization History  Administered Date(s) Administered   MODERNA COVID-19 SARS-COV-2 PEDS BIVALENT BOOSTER 4yr-19yr 02/21/2020   Moderna Covid-19 Vaccine Bivalent Booster 66yrs & up 12/29/2020   Moderna Sars-Covid-2 Vaccination 05/29/2019, 06/26/2019     Objective: Vital Signs: There were no vitals taken for this visit.   Physical Exam Vitals and nursing note reviewed.  Constitutional:      Appearance: She is well-developed.  HENT:     Head: Normocephalic and atraumatic.  Eyes:     Conjunctiva/sclera: Conjunctivae normal.  Cardiovascular:     Rate and Rhythm: Normal rate and regular rhythm.     Heart sounds: Normal heart sounds.  Pulmonary:     Effort: Pulmonary effort is normal.     Breath sounds: Normal breath sounds.  Abdominal:     General: Bowel sounds are normal.     Palpations: Abdomen is soft.  Musculoskeletal:     Cervical back: Normal range of motion.  Lymphadenopathy:     Cervical: No cervical adenopathy.  Skin:    General: Skin is warm and dry.     Capillary Refill: Capillary refill takes less than 2 seconds.  Neurological:     Mental Status: She is alert and oriented to person, place, and time.  Psychiatric:  Behavior: Behavior normal.      Musculoskeletal Exam: ***  CDAI Exam: CDAI Score: -- Patient Global: --; Provider Global: -- Swollen: --; Tender: -- Joint Exam 02/10/2024   No joint exam has been documented for this visit   There is currently no information documented on the homunculus. Go to the Rheumatology activity and complete the homunculus joint exam.  Investigation: No additional findings.  Imaging: No results found.  Recent Labs: Lab Results  Component Value Date   WBC 6.3 06/07/2009   HGB 12.2 06/07/2009    PLT 246 06/07/2009   NA 138 07/26/2016   K 4.3 07/26/2016   CL 104 07/26/2016   CO2 26 07/26/2016   GLUCOSE 98 07/26/2016   BUN 18 07/26/2016   CREATININE 0.93 07/26/2016   BILITOT 0.6 07/26/2016   ALKPHOS 68 07/26/2016   AST 18 07/26/2016   ALT 13 07/26/2016   PROT 7.0 07/26/2016   ALBUMIN 4.1 07/26/2016   CALCIUM 10.0 07/26/2016   GFRAA 77 07/26/2016    Speciality Comments: No specialty comments available.  Procedures:  No procedures performed Allergies: Patient has no known allergies.   Assessment / Plan:     Visit Diagnoses: Fibromyalgia  Primary insomnia  Other fatigue  Primary osteoarthritis of both knees - visco march/april 2023  Primary osteoarthritis of both hands  Adhesive capsulitis of left shoulder  DDD (degenerative disc disease), cervical  DDD (degenerative disc disease), thoracic  Degeneration of intervertebral disc of lumbar region, unspecified whether pain present  Anxiety and depression  Dyslipidemia  RLS (restless legs syndrome)  Orders: No orders of the defined types were placed in this encounter.  No orders of the defined types were placed in this encounter.   Face-to-face time spent with patient was *** minutes. Greater than 50% of time was spent in counseling and coordination of care.  Follow-Up Instructions: No follow-ups on file.   Waddell CHRISTELLA Craze, PA-C  Note - This record has been created using Dragon software.  Chart creation errors have been sought, but may not always  have been located. Such creation errors do not reflect on  the standard of medical care.

## 2024-02-10 ENCOUNTER — Ambulatory Visit: Admitting: Physician Assistant

## 2024-02-10 DIAGNOSIS — M5030 Other cervical disc degeneration, unspecified cervical region: Secondary | ICD-10-CM

## 2024-02-10 DIAGNOSIS — F419 Anxiety disorder, unspecified: Secondary | ICD-10-CM

## 2024-02-10 DIAGNOSIS — M5134 Other intervertebral disc degeneration, thoracic region: Secondary | ICD-10-CM

## 2024-02-10 DIAGNOSIS — M170 Bilateral primary osteoarthritis of knee: Secondary | ICD-10-CM

## 2024-02-10 DIAGNOSIS — R5383 Other fatigue: Secondary | ICD-10-CM

## 2024-02-10 DIAGNOSIS — M19041 Primary osteoarthritis, right hand: Secondary | ICD-10-CM

## 2024-02-10 DIAGNOSIS — M51369 Other intervertebral disc degeneration, lumbar region without mention of lumbar back pain or lower extremity pain: Secondary | ICD-10-CM

## 2024-02-10 DIAGNOSIS — M7502 Adhesive capsulitis of left shoulder: Secondary | ICD-10-CM

## 2024-02-10 DIAGNOSIS — F5101 Primary insomnia: Secondary | ICD-10-CM

## 2024-02-10 DIAGNOSIS — M797 Fibromyalgia: Secondary | ICD-10-CM

## 2024-02-10 DIAGNOSIS — E785 Hyperlipidemia, unspecified: Secondary | ICD-10-CM

## 2024-02-10 DIAGNOSIS — G2581 Restless legs syndrome: Secondary | ICD-10-CM
